# Patient Record
Sex: Female | Born: 1975 | Race: White | Marital: Married | Smoking: Former smoker
Health system: Southern US, Community
[De-identification: ages and names within clinical notes are randomized; demographics above are authoritative.]

## PROBLEM LIST (undated history)

## (undated) DIAGNOSIS — F32A Depression, unspecified: Secondary | ICD-10-CM

## (undated) DIAGNOSIS — R87629 Unspecified abnormal cytological findings in specimens from vagina: Secondary | ICD-10-CM

## (undated) DIAGNOSIS — G43909 Migraine, unspecified, not intractable, without status migrainosus: Secondary | ICD-10-CM

## (undated) DIAGNOSIS — M5430 Sciatica, unspecified side: Secondary | ICD-10-CM

## (undated) DIAGNOSIS — R42 Dizziness and giddiness: Secondary | ICD-10-CM

## (undated) DIAGNOSIS — K219 Gastro-esophageal reflux disease without esophagitis: Secondary | ICD-10-CM

## (undated) DIAGNOSIS — F329 Major depressive disorder, single episode, unspecified: Secondary | ICD-10-CM

## (undated) DIAGNOSIS — T1490XA Injury, unspecified, initial encounter: Secondary | ICD-10-CM

## (undated) DIAGNOSIS — N7011 Chronic salpingitis: Secondary | ICD-10-CM

## (undated) DIAGNOSIS — F419 Anxiety disorder, unspecified: Secondary | ICD-10-CM

## (undated) DIAGNOSIS — I499 Cardiac arrhythmia, unspecified: Secondary | ICD-10-CM

## (undated) DIAGNOSIS — I10 Essential (primary) hypertension: Secondary | ICD-10-CM

## (undated) HISTORY — DX: Injury, unspecified, initial encounter: T14.90XA

## (undated) HISTORY — DX: Essential (primary) hypertension: I10

## (undated) HISTORY — PX: DILATION AND CURETTAGE OF UTERUS: SHX78

## (undated) HISTORY — DX: Unspecified abnormal cytological findings in specimens from vagina: R87.629

## (undated) HISTORY — DX: Chronic salpingitis: N70.11

## (undated) HISTORY — PX: MOUTH SURGERY: SHX715

## (undated) HISTORY — DX: Cardiac arrhythmia, unspecified: I49.9

## (undated) HISTORY — PX: TUBAL LIGATION: SHX77

---

## 2001-05-24 ENCOUNTER — Emergency Department (HOSPITAL_COMMUNITY): Admission: EM | Admit: 2001-05-24 | Discharge: 2001-05-24 | Payer: Self-pay | Admitting: Emergency Medicine

## 2001-12-25 ENCOUNTER — Emergency Department (HOSPITAL_COMMUNITY): Admission: EM | Admit: 2001-12-25 | Discharge: 2001-12-25 | Payer: Self-pay | Admitting: Emergency Medicine

## 2002-01-01 ENCOUNTER — Emergency Department (HOSPITAL_COMMUNITY): Admission: EM | Admit: 2002-01-01 | Discharge: 2002-01-01 | Payer: Self-pay

## 2002-04-18 ENCOUNTER — Emergency Department (HOSPITAL_COMMUNITY): Admission: EM | Admit: 2002-04-18 | Discharge: 2002-04-18 | Payer: Self-pay | Admitting: Emergency Medicine

## 2003-03-31 ENCOUNTER — Encounter: Admission: RE | Admit: 2003-03-31 | Discharge: 2003-03-31 | Payer: Self-pay | Admitting: Internal Medicine

## 2009-10-21 ENCOUNTER — Emergency Department (HOSPITAL_COMMUNITY): Admission: EM | Admit: 2009-10-21 | Discharge: 2009-10-21 | Payer: Self-pay | Admitting: Emergency Medicine

## 2012-03-02 ENCOUNTER — Encounter (HOSPITAL_COMMUNITY): Payer: Self-pay

## 2012-03-02 ENCOUNTER — Emergency Department (HOSPITAL_COMMUNITY)
Admission: EM | Admit: 2012-03-02 | Discharge: 2012-03-02 | Disposition: A | Discharge status: Home / Self Care | Payer: Self-pay | Attending: Emergency Medicine | Admitting: Emergency Medicine

## 2012-03-02 DIAGNOSIS — R102 Pelvic and perineal pain: Secondary | ICD-10-CM

## 2012-03-02 DIAGNOSIS — Z9889 Other specified postprocedural states: Secondary | ICD-10-CM | POA: Insufficient documentation

## 2012-03-02 DIAGNOSIS — Z9851 Tubal ligation status: Secondary | ICD-10-CM | POA: Insufficient documentation

## 2012-03-02 DIAGNOSIS — N949 Unspecified condition associated with female genital organs and menstrual cycle: Secondary | ICD-10-CM | POA: Insufficient documentation

## 2012-03-02 DIAGNOSIS — G43909 Migraine, unspecified, not intractable, without status migrainosus: Secondary | ICD-10-CM | POA: Insufficient documentation

## 2012-03-02 DIAGNOSIS — Z3202 Encounter for pregnancy test, result negative: Secondary | ICD-10-CM | POA: Insufficient documentation

## 2012-03-02 DIAGNOSIS — H81399 Other peripheral vertigo, unspecified ear: Secondary | ICD-10-CM | POA: Insufficient documentation

## 2012-03-02 HISTORY — DX: Migraine, unspecified, not intractable, without status migrainosus: G43.909

## 2012-03-02 HISTORY — DX: Dizziness and giddiness: R42

## 2012-03-02 LAB — URINALYSIS, ROUTINE W REFLEX MICROSCOPIC
Glucose, UA: NEGATIVE mg/dL
Ketones, ur: NEGATIVE mg/dL
Leukocytes, UA: NEGATIVE
Protein, ur: NEGATIVE mg/dL

## 2012-03-02 LAB — WET PREP, GENITAL: Clue Cells Wet Prep HPF POC: NONE SEEN

## 2012-03-02 LAB — URINE MICROSCOPIC-ADD ON

## 2012-03-02 MED ORDER — DIPHENHYDRAMINE HCL 50 MG/ML IJ SOLN
50.0000 mg | Freq: Once | INTRAMUSCULAR | Status: AC
  Administered 2012-03-02: 50 mg via INTRAMUSCULAR
  Filled 2012-03-02: qty 1

## 2012-03-02 MED ORDER — KETOROLAC TROMETHAMINE 60 MG/2ML IM SOLN
60.0000 mg | Freq: Once | INTRAMUSCULAR | Status: AC
  Administered 2012-03-02: 60 mg via INTRAMUSCULAR
  Filled 2012-03-02: qty 2

## 2012-03-02 MED ORDER — PROMETHAZINE HCL 25 MG PO TABS: 25.0000 mg | ORAL_TABLET | Freq: Four times a day (QID) | ORAL | Status: DC | PRN

## 2012-03-02 MED ORDER — PROMETHAZINE HCL 25 MG/ML IJ SOLN
25.0000 mg | Freq: Once | INTRAMUSCULAR | Status: AC
  Administered 2012-03-02: 25 mg via INTRAMUSCULAR
  Filled 2012-03-02: qty 1

## 2012-03-02 NOTE — ED Provider Notes (Signed)
History     CSN: [id number]  Arrival date & time 03/02/12  9562   First MD Initiated Contact with Patient 03/02/12 1000      Chief Complaint  Patient presents with  . Dizziness  . Migraine  . Pelvic Pain     HPI Pt was seen at 1020.   Per pt, c/o gradual onset and persistence of constant multiple complaints for the past 1 to 2 weeks.  Per pt, c/o gradual onset and persistence of constant acute flair of her chronic migraine headache for the past 10 days.  Describes the headache as per her usual chronic migraine headache pain pattern for many years.  Denies headache was sudden or maximal in onset or at any time.  Denies visual changes, no focal motor weakness, no tingling/numbness in extremities, no fevers, no neck pain, no rash. Pt also c/o gradual onset and persistence of constant suprapubic pelvic "pain" for the past 2 weeks.  Has been associated with vaginal discharge with "odor" and painful intercourse.  Pt also c/o sudden onset and persistence of intermittent episodes of "dizziness" since this morning. Describes her symptoms as "everything is moving" and "like I'm on a moving boat."  Worsens with walking, body position change, and turning her head side to side. States she has experienced this "dizziness" intermittently for many years, but did not see a doctor for it.  Denies dysuria, no back pain, no N/V/D.     Past Medical History  Diagnosis Date  . Migraine   . Dizziness     recurrent    Past Surgical History  Procedure Date  . Mouth surgery   . Dilation and curettage of uterus   . Tubal ligation     History  Substance Use Topics  . Smoking status: Never Smoker   . Smokeless tobacco: Never Used  . Alcohol Use: No     Review of Systems ROS: Statement: All systems negative except as marked or noted in the HPI; Constitutional: Negative for fever and chills. ; ; Eyes: Negative for eye pain, redness and discharge. ; ; ENMT: Negative for ear pain, hoarseness, nasal  congestion, sinus pressure and sore throat. ; ; Cardiovascular: Negative for chest pain, palpitations, diaphoresis, dyspnea and peripheral edema. ; ; Respiratory: Negative for cough, wheezing and stridor. ; ; Gastrointestinal: Negative for nausea, vomiting, diarrhea, abdominal pain, blood in stool, hematemesis, jaundice and rectal bleeding. . ; ; Genitourinary: Negative for dysuria, flank pain and hematuria. ; ; GYN:  +pelvic pain, vaginal discharge, pain with intercourse. No vaginal bleeding, no vulvar pain.;; Musculoskeletal: Negative for back pain and neck pain. Negative for swelling and trauma.; ; Skin: Negative for pruritus, rash, abrasions, blisters, bruising and skin lesion.; ; Neuro: +vertigo, migraine headache. Negative for lightheadedness and neck stiffness. Negative for weakness, altered level of consciousness , altered mental status, extremity weakness, paresthesias, involuntary movement, seizure and syncope.       Allergies  Review of patient's allergies indicates no known allergies.  Home Medications  No current outpatient prescriptions on file.  BP 129/64  Pulse 104  Temp 98.2 F (36.8 C)  Resp 16  Wt 159 lb (72.122 kg)  LMP 02/17/2012  Physical Exam 1025: Physical examination:  Nursing notes reviewed; Vital signs and O2 SAT reviewed;  Constitutional: Well developed, Well nourished, Well hydrated, In no acute distress; Head:  Normocephalic, atraumatic; Eyes: EOMI, PERRL, No scleral icterus; ENMT: TM's clear. +edemetous nasal turbinates bilat with clear rhinorrhea. Mouth and pharynx without lesions. No tonsillar  exudates. No intra-oral edema. No hoarse voice, no drooling, no stridor. No pain with manipulation of larynx. Mouth and pharynx normal, Mucous membranes moist; Neck: Supple, Full range of motion, No lymphadenopathy; Cardiovascular: Regular rate and rhythm, No murmur, rub, or gallop; Respiratory: Breath sounds clear & equal bilaterally, No rales, rhonchi, wheezes.  Speaking  full sentences with ease, Normal respiratory effort/excursion; Chest: Nontender, Movement normal; Abdomen: Soft, Nontender, Nondistended, Normal bowel sounds; Genitourinary: No CVA tenderness; Pelvic exam performed with permission of pt and female ED RN assist during exam.  External genitalia w/o lesions. Vaginal vault with thick white discharge.  Cervix w/o lesions, +friable, GC/chlam and wet prep obtained and sent to lab.  Bimanual exam w/o CMT, uterine or adnexal tenderness.;; Extremities: Pulses normal, No tenderness, No edema, No calf edema or asymmetry.; Neuro: AA&Ox3, Major CN grossly intact.  Strength 5/5 equal bilat UE's and LE's.  DTR 2/4 equal bilat UE's and LE's.  No gross sensory deficits.  Normal cerebellar testing bilat UE's (finger-nose) and LE's (heel-shin). Speech clear.  No facial droop. +left horizontal gaze fatigable nystagmus which reproduces her symptoms.;; Skin: Color normal, Warm, Dry.   ED Course  Procedures    MDM  MDM Reviewed: previous chart, nursing note and vitals Interpretation: labs   Results for orders placed during the hospital encounter of 03/02/12  URINALYSIS, ROUTINE W REFLEX MICROSCOPIC      Component Value Range   Color, Urine YELLOW  YELLOW   APPearance CLEAR  CLEAR   Specific Gravity, Urine 1.010  1.005 - 1.030   pH 7.5  5.0 - 8.0   Glucose, UA NEGATIVE  NEGATIVE mg/dL   Hgb urine dipstick MODERATE (*) NEGATIVE   Bilirubin Urine NEGATIVE  NEGATIVE   Ketones, ur NEGATIVE  NEGATIVE mg/dL   Protein, ur NEGATIVE  NEGATIVE mg/dL   Urobilinogen, UA 0.2  0.0 - 1.0 mg/dL   Nitrite NEGATIVE  NEGATIVE   Leukocytes, UA NEGATIVE  NEGATIVE  PREGNANCY, URINE      Component Value Range   Preg Test, Ur NEGATIVE  NEGATIVE  WET PREP, GENITAL      Component Value Range   Yeast Wet Prep HPF POC NONE SEEN  NONE SEEN   Trich, Wet Prep NONE SEEN  NONE SEEN   Clue Cells Wet Prep HPF POC NONE SEEN  NONE SEEN   WBC, Wet Prep HPF POC RARE (*) NONE SEEN  URINE  MICROSCOPIC-ADD ON      Component Value Range   Squamous Epithelial / LPF FEW (*) RARE   RBC / HPF 7-10  <3 RBC/hpf   Bacteria, UA FEW (*) RARE     1415:  Pt's symptoms improved after meds. Not orthostatic on VS.  Has ambulated with steady gait. Wants to go home now.  Dx and testing d/w pt.  Questions answered.  Verb understanding, agreeable to d/c home with outpt f/u.            Laray Anger, DO 03/03/12 1933

## 2012-03-02 NOTE — ED Notes (Signed)
C/o dizziness. Went to urgent care and instructed to come here.

## 2012-03-03 LAB — GC/CHLAMYDIA PROBE AMP: GC Probe RNA: NEGATIVE

## 2012-07-06 ENCOUNTER — Encounter: Payer: Self-pay | Admitting: Adult Health

## 2012-07-06 ENCOUNTER — Encounter: Payer: Self-pay | Admitting: *Deleted

## 2013-12-31 ENCOUNTER — Encounter (HOSPITAL_COMMUNITY): Payer: Self-pay | Admitting: *Deleted

## 2013-12-31 ENCOUNTER — Emergency Department (HOSPITAL_COMMUNITY)
Admission: EM | Admit: 2013-12-31 | Discharge: 2013-12-31 | Disposition: A | Discharge status: Home / Self Care | Payer: Federal, State, Local not specified - PPO | Attending: Emergency Medicine | Admitting: Emergency Medicine

## 2013-12-31 DIAGNOSIS — Z720 Tobacco use: Secondary | ICD-10-CM | POA: Diagnosis not present

## 2013-12-31 DIAGNOSIS — R51 Headache: Secondary | ICD-10-CM | POA: Insufficient documentation

## 2013-12-31 DIAGNOSIS — Z8679 Personal history of other diseases of the circulatory system: Secondary | ICD-10-CM | POA: Diagnosis not present

## 2013-12-31 DIAGNOSIS — Z8739 Personal history of other diseases of the musculoskeletal system and connective tissue: Secondary | ICD-10-CM | POA: Diagnosis not present

## 2013-12-31 DIAGNOSIS — R519 Headache, unspecified: Secondary | ICD-10-CM

## 2013-12-31 HISTORY — DX: Dizziness and giddiness: R42

## 2013-12-31 HISTORY — DX: Sciatica, unspecified side: M54.30

## 2013-12-31 MED ORDER — METOCLOPRAMIDE HCL 5 MG/ML IJ SOLN
10.0000 mg | Freq: Once | INTRAMUSCULAR | Status: AC
  Administered 2013-12-31: 10 mg via INTRAVENOUS
  Filled 2013-12-31: qty 2

## 2013-12-31 MED ORDER — DIPHENHYDRAMINE HCL 50 MG/ML IJ SOLN
50.0000 mg | Freq: Once | INTRAMUSCULAR | Status: AC
  Administered 2013-12-31: 50 mg via INTRAVENOUS
  Filled 2013-12-31: qty 1

## 2013-12-31 MED ORDER — KETOROLAC TROMETHAMINE 30 MG/ML IJ SOLN
30.0000 mg | Freq: Once | INTRAMUSCULAR | Status: AC
  Administered 2013-12-31: 30 mg via INTRAVENOUS
  Filled 2013-12-31: qty 1

## 2013-12-31 MED ORDER — HYDROCODONE-ACETAMINOPHEN 5-325 MG PO TABS: 1.0000 | ORAL_TABLET | Freq: Four times a day (QID) | ORAL | Status: DC | PRN

## 2013-12-31 MED ORDER — HYDROMORPHONE HCL 1 MG/ML IJ SOLN
1.0000 mg | Freq: Once | INTRAMUSCULAR | Status: AC
  Administered 2013-12-31: 1 mg via INTRAVENOUS
  Filled 2013-12-31: qty 1

## 2013-12-31 NOTE — ED Provider Notes (Signed)
CSN: [id number]     Arrival date & time 12/31/13  1312 History  This chart was scribed for Benny LennertJoseph L Oria Klimas, MD by Abel PrestoKara Demonbreun, ED Scribe. This patient was seen in room APA17/APA17 and the patient's care was started at 2:34 PM.    Chief Complaint  Patient presents with  . Headache     Patient is a 38 y.o. female presenting with headaches. The history is provided by the patient. No language interpreter was used.  Headache Pain location:  Generalized Radiates to:  Does not radiate Onset quality:  Sudden Duration:  3 days Timing:  Constant Progression:  Unchanged Chronicity:  Recurrent Similar to prior headaches: yes   Context comment:  History of migraines Relieved by:  Nothing Worsened by:  Light Ineffective treatments: execedrin and naproxen. Associated symptoms: photophobia   Associated symptoms: no abdominal pain, no back pain, no congestion, no cough, no diarrhea, no fatigue, no seizures and no sinus pressure     HPI Comments: Jenna Jones is a 38 y.o. female who presents to the Emergency Department complaining of headache with onset Friday.  Pt states headache is similar to previous migraine. Pt indicates headache worsens with light. Pt states she has taken 6 execedrin extra strength, 2 naproxen 375 mg today with no relief. Pt has been given Benedryl and Phenergan during previous visitations to the ED.     Past Medical History  Diagnosis Date  . Migraine   . Dizziness     recurrent  . Sciatica   . Vertigo    Past Surgical History  Procedure Laterality Date  . Mouth surgery    . Dilation and curettage of uterus    . Tubal ligation     History reviewed. No pertinent family history. History  Substance Use Topics  . Smoking status: Current Every Day Smoker    Types: Cigarettes  . Smokeless tobacco: Never Used  . Alcohol Use: No   OB History    No data available     Review of Systems  Constitutional: Negative for appetite change and fatigue.  HENT: Negative  for congestion, ear discharge and sinus pressure.   Eyes: Positive for photophobia. Negative for discharge.  Respiratory: Negative for cough.   Cardiovascular: Negative for chest pain.  Gastrointestinal: Negative for abdominal pain and diarrhea.  Genitourinary: Negative for frequency and hematuria.  Musculoskeletal: Negative for back pain.  Skin: Negative for rash.  Neurological: Positive for headaches. Negative for seizures.  Psychiatric/Behavioral: Negative for hallucinations.      Allergies  Codeine  Home Medications   Prior to Admission medications   Medication Sig Start Date End Date Taking? Authorizing Provider  promethazine (PHENERGAN) 25 MG tablet Take 1 tablet (25 mg total) by mouth every 6 (six) hours as needed for nausea. 03/02/12   Samuel JesterKathleen McManus, DO   BP 149/92 mmHg  Pulse 102  Temp(Src) 99 F (37.2 C) (Oral)  Resp 18  Ht 5\' 2"  (1.575 m)  Wt 180 lb (81.647 kg)  BMI 32.91 kg/m2  SpO2 100%  LMP 12/17/2013 (Exact Date) Physical Exam  Constitutional: She is oriented to person, place, and time. She appears well-developed.  HENT:  Head: Normocephalic.  Eyes: Conjunctivae and EOM are normal. No scleral icterus.  Neck: Neck supple. No thyromegaly present.  Cardiovascular: Normal rate and regular rhythm.  Exam reveals no gallop and no friction rub.   No murmur heard. Pulmonary/Chest: No stridor. She has no wheezes. She has no rales. She exhibits no tenderness.  Abdominal: She exhibits no distension. There is no tenderness. There is no rebound.  Musculoskeletal: Normal range of motion. She exhibits no edema.  Lymphadenopathy:    She has no cervical adenopathy.  Neurological: She is oriented to person, place, and time. She exhibits normal muscle tone. Coordination normal.  Skin: No rash noted. No erythema.  Psychiatric: She has a normal mood and affect. Her behavior is normal.  Nursing note and vitals reviewed.   ED Course  Procedures (including critical care  time) DIAGNOSTIC STUDIES: Oxygen Saturation is 100% on room air, normal by my interpretation.    COORDINATION OF CARE: 2:43 PM Discussed treatment plan with patient at beside, the patient agrees with the plan and has no further questions at this time.   Labs Review Labs Reviewed - No data to display  Imaging Review No results found.   EKG Interpretation None      MDM   Final diagnoses:  None     The chart was scribed for me under my direct supervision.  I personally performed the history, physical, and medical decision making and all procedures in the evaluation of this patient.Benny Lennert.    Vicki Chaffin L Niva Murren, MD 12/31/13 [phone]

## 2013-12-31 NOTE — ED Notes (Signed)
Headache since Friday, with nausea, and dizziness.  No hx of HI,  Hx of migraines since 38 yo

## 2013-12-31 NOTE — Discharge Instructions (Signed)
Follow up as needed

## 2014-05-21 ENCOUNTER — Encounter (HOSPITAL_COMMUNITY): Payer: Self-pay | Admitting: Emergency Medicine

## 2014-05-21 ENCOUNTER — Emergency Department (HOSPITAL_COMMUNITY)
Admission: EM | Admit: 2014-05-21 | Discharge: 2014-05-21 | Disposition: A | Discharge status: Home / Self Care | Payer: Federal, State, Local not specified - PPO | Attending: Emergency Medicine | Admitting: Emergency Medicine

## 2014-05-21 DIAGNOSIS — G43909 Migraine, unspecified, not intractable, without status migrainosus: Secondary | ICD-10-CM | POA: Insufficient documentation

## 2014-05-21 DIAGNOSIS — M5430 Sciatica, unspecified side: Secondary | ICD-10-CM | POA: Insufficient documentation

## 2014-05-21 DIAGNOSIS — Z720 Tobacco use: Secondary | ICD-10-CM | POA: Diagnosis not present

## 2014-05-21 DIAGNOSIS — G8929 Other chronic pain: Secondary | ICD-10-CM | POA: Diagnosis not present

## 2014-05-21 DIAGNOSIS — Z791 Long term (current) use of non-steroidal anti-inflammatories (NSAID): Secondary | ICD-10-CM | POA: Diagnosis not present

## 2014-05-21 DIAGNOSIS — R51 Headache: Secondary | ICD-10-CM | POA: Diagnosis present

## 2014-05-21 DIAGNOSIS — R040 Epistaxis: Secondary | ICD-10-CM | POA: Diagnosis not present

## 2014-05-21 DIAGNOSIS — R000 Tachycardia, unspecified: Secondary | ICD-10-CM | POA: Insufficient documentation

## 2014-05-21 DIAGNOSIS — R112 Nausea with vomiting, unspecified: Secondary | ICD-10-CM | POA: Insufficient documentation

## 2014-05-21 DIAGNOSIS — Z7982 Long term (current) use of aspirin: Secondary | ICD-10-CM | POA: Insufficient documentation

## 2014-05-21 DIAGNOSIS — G43009 Migraine without aura, not intractable, without status migrainosus: Secondary | ICD-10-CM

## 2014-05-21 MED ORDER — DIPHENHYDRAMINE HCL 50 MG/ML IJ SOLN
25.0000 mg | Freq: Once | INTRAMUSCULAR | Status: AC
  Administered 2014-05-21: 25 mg via INTRAVENOUS
  Filled 2014-05-21: qty 1

## 2014-05-21 MED ORDER — DEXAMETHASONE SODIUM PHOSPHATE 4 MG/ML IJ SOLN
8.0000 mg | Freq: Once | INTRAMUSCULAR | Status: AC
  Administered 2014-05-21: 8 mg via INTRAVENOUS
  Filled 2014-05-21: qty 2

## 2014-05-21 MED ORDER — SUMATRIPTAN SUCCINATE 100 MG PO TABS: 100.0000 mg | ORAL_TABLET | ORAL | Status: DC | PRN

## 2014-05-21 MED ORDER — KETOROLAC TROMETHAMINE 30 MG/ML IJ SOLN
30.0000 mg | Freq: Once | INTRAMUSCULAR | Status: AC
  Administered 2014-05-21: 30 mg via INTRAVENOUS
  Filled 2014-05-21: qty 1

## 2014-05-21 MED ORDER — PROMETHAZINE HCL 25 MG PO TABS: 25.0000 mg | ORAL_TABLET | Freq: Four times a day (QID) | ORAL | Status: DC | PRN

## 2014-05-21 MED ORDER — PROMETHAZINE HCL 25 MG/ML IJ SOLN
25.0000 mg | Freq: Once | INTRAMUSCULAR | Status: AC
  Administered 2014-05-21: 25 mg via INTRAVENOUS
  Filled 2014-05-21: qty 1

## 2014-05-21 MED ORDER — SODIUM CHLORIDE 0.9 % IV BOLUS (SEPSIS)
1000.0000 mL | Freq: Once | INTRAVENOUS | Status: AC
Start: 2014-05-21 — End: 2014-05-21
  Administered 2014-05-21: 1000 mL via INTRAVENOUS

## 2014-05-21 NOTE — ED Provider Notes (Signed)
CSN: [id number]     Arrival date & time 05/21/14  1531 History   This chart was scribed for Jenna CoreNathan Seona Clemenson, MD by Evon Slackerrance Branch, ED Scribe. This patient was seen in room APA16A/APA16A and the patient's care was started at 4:14 PM.     Chief Complaint  Patient presents with  . Migraine   The history is provided by the patient. No language interpreter was used.   HPI Comments: Jenna Jones is a 39 y.o. female who presents to the Emergency Department complaining of worsening migraine HA onset today at 11:30 AM. Pt states that her HA is located on the left side behind the eye and temporal region. Pt states she has some associated nausea, vomiting and photophobia. Pt reports epistaxis as well but states that this is normal when she has migraines. Pt states she has tried tylenol and ibuprofen with no relief. Pt states that HA feels like previous headaches. Pt states she gets HA's almost daily. Pt denies dizziness or related symptoms.    Past Medical History  Diagnosis Date  . Migraine   . Dizziness     recurrent  . Sciatica   . Vertigo    Past Surgical History  Procedure Laterality Date  . Mouth surgery    . Dilation and curettage of uterus    . Tubal ligation     Family History  Problem Relation Age of Onset  . Cancer Other   . Diabetes Other   . Hypertension Other   . Thyroid disease Other    History  Substance Use Topics  . Smoking status: Current Every Day Smoker -- 0.50 packs/day for 16 years    Types: Cigarettes  . Smokeless tobacco: Never Used  . Alcohol Use: No   OB History    Gravida Para Term Preterm AB TAB SAB Ectopic Multiple Living   8 5 5  3  3   5       Review of Systems  HENT: Positive for nosebleeds.   Eyes: Positive for photophobia.  Gastrointestinal: Positive for nausea and vomiting.  Neurological: Positive for headaches. Negative for dizziness.  All other systems reviewed and are negative.   Allergies  Codeine  Home Medications   Prior to  Admission medications   Medication Sig Start Date End Date Taking? Authorizing Provider  aspirin-acetaminophen-caffeine (EXCEDRIN MIGRAINE) [phone] MG per tablet Take 3-4 tablets by mouth daily as needed for headache or migraine.    Historical Provider, MD  HYDROcodone-acetaminophen (NORCO/VICODIN) 5-325 MG per tablet Take 1 tablet by mouth every 6 (six) hours as needed for moderate pain. 12/31/13   Bethann BerkshireJoseph Zammit, MD  ibuprofen (ADVIL,MOTRIN) 200 MG tablet Take 1,600 mg by mouth daily as needed for headache.    Historical Provider, MD  naproxen (NAPROSYN) 375 MG tablet Take 500 mg by mouth once.    Historical Provider, MD  promethazine (PHENERGAN) 25 MG tablet Take 1 tablet (25 mg total) by mouth every 6 (six) hours as needed for nausea. Patient not taking: Reported on 12/31/2013 03/02/12   Samuel JesterKathleen McManus, DO  traMADol (ULTRAM) 50 MG tablet Take 50 mg by mouth once.    Historical Provider, MD   BP 155/90 mmHg  Pulse 97  Temp(Src) 98 F (36.7 C) (Oral)  Resp 18  Wt 180 lb (81.647 kg)  SpO2 100%  LMP 05/14/2014   Physical Exam  Constitutional: She is oriented to person, place, and time. She appears well-developed and well-nourished. No distress.  HENT:  Head: Normocephalic and  atraumatic.  mildly photophobic, some tenderness to left side of face without swelling.   Eyes: Conjunctivae and EOM are normal.  Neck: Neck supple. No tracheal deviation present.  Cardiovascular: Tachycardia present.   Pulmonary/Chest: Effort normal. No respiratory distress.  Abdominal: There is no tenderness.  Musculoskeletal: Normal range of motion.  Neurological: She is alert and oriented to person, place, and time.  Skin: Skin is warm and dry.  Psychiatric: She has a normal mood and affect. Her behavior is normal.  Nursing note and vitals reviewed.   ED Course  Procedures (including critical care time) Labs Review Labs Reviewed - No data to display  Imaging Review No results found.   EKG  Interpretation None      MDM   Final diagnoses:  None    Patient with headaches. Frequent daily headaches. History of migraines and cluster headaches. Feels somewhat better after treatment. Pain is gone from an 8 to a 3. Will give patient some Phenergan and Imitrex for home. Patient instructed on the need for neurology follow-up. Will discharge. I personally performed the services described in this documentation, which was scribed in my presence. The recorded information has been reviewed and is accurate.        Jenna Core, MD 05/21/14 1902

## 2014-05-21 NOTE — ED Notes (Signed)
Patient c/o migraine headache that started last night. Per patient hx of cluster and auro migraines. Per patient this migraine feels like cluster migraine behind left eye. Denies any vomiting but reports nausea and small amount of bleeding from left nare which patient states is not unusual when she has a migraine. No bleeding noted at this time. Denies any dizziness.

## 2014-05-21 NOTE — Discharge Instructions (Signed)

## 2014-05-21 NOTE — ED Notes (Signed)
Patient placed on NRB.

## 2014-12-30 DIAGNOSIS — R42 Dizziness and giddiness: Secondary | ICD-10-CM | POA: Insufficient documentation

## 2015-06-02 ENCOUNTER — Encounter (HOSPITAL_COMMUNITY): Payer: Self-pay | Admitting: Emergency Medicine

## 2015-06-02 ENCOUNTER — Emergency Department (HOSPITAL_COMMUNITY)
Admission: EM | Admit: 2015-06-02 | Discharge: 2015-06-03 | Disposition: A | Discharge status: Home / Self Care | Payer: Federal, State, Local not specified - PPO | Attending: Emergency Medicine | Admitting: Emergency Medicine

## 2015-06-02 DIAGNOSIS — R200 Anesthesia of skin: Secondary | ICD-10-CM | POA: Diagnosis not present

## 2015-06-02 DIAGNOSIS — Z791 Long term (current) use of non-steroidal anti-inflammatories (NSAID): Secondary | ICD-10-CM | POA: Diagnosis not present

## 2015-06-02 DIAGNOSIS — G43909 Migraine, unspecified, not intractable, without status migrainosus: Secondary | ICD-10-CM | POA: Diagnosis not present

## 2015-06-02 DIAGNOSIS — H9319 Tinnitus, unspecified ear: Secondary | ICD-10-CM | POA: Insufficient documentation

## 2015-06-02 DIAGNOSIS — F17210 Nicotine dependence, cigarettes, uncomplicated: Secondary | ICD-10-CM | POA: Insufficient documentation

## 2015-06-02 DIAGNOSIS — G43009 Migraine without aura, not intractable, without status migrainosus: Secondary | ICD-10-CM | POA: Insufficient documentation

## 2015-06-02 LAB — CBC WITH DIFFERENTIAL/PLATELET
BASOS ABS: 0 10*3/uL (ref 0.0–0.1)
BASOS PCT: 0 %
Eosinophils Absolute: 0.1 10*3/uL (ref 0.0–0.7)
Eosinophils Relative: 1 %
HEMATOCRIT: 38 % (ref 36.0–46.0)
HEMOGLOBIN: 13 g/dL (ref 12.0–15.0)
Lymphocytes Relative: 26 %
Lymphs Abs: 2.5 10*3/uL (ref 0.7–4.0)
MCH: 31.2 pg (ref 26.0–34.0)
MCHC: 34.2 g/dL (ref 30.0–36.0)
MCV: 91.1 fL (ref 78.0–100.0)
MONOS PCT: 5 %
Monocytes Absolute: 0.5 10*3/uL (ref 0.1–1.0)
NEUTROS ABS: 6.6 10*3/uL (ref 1.7–7.7)
NEUTROS PCT: 68 %
Platelets: 402 10*3/uL — ABNORMAL HIGH (ref 150–400)
RBC: 4.17 MIL/uL (ref 3.87–5.11)
RDW: 13 % (ref 11.5–15.5)
WBC: 9.8 10*3/uL (ref 4.0–10.5)

## 2015-06-02 MED ORDER — DEXAMETHASONE SODIUM PHOSPHATE 10 MG/ML IJ SOLN
10.0000 mg | Freq: Once | INTRAMUSCULAR | Status: AC
  Administered 2015-06-02: 10 mg via INTRAVENOUS
  Filled 2015-06-02: qty 1

## 2015-06-02 MED ORDER — DIPHENHYDRAMINE HCL 50 MG/ML IJ SOLN
25.0000 mg | Freq: Once | INTRAMUSCULAR | Status: AC
  Administered 2015-06-02: 25 mg via INTRAVENOUS
  Filled 2015-06-02: qty 1

## 2015-06-02 MED ORDER — METOCLOPRAMIDE HCL 5 MG/ML IJ SOLN
10.0000 mg | Freq: Once | INTRAMUSCULAR | Status: AC
  Administered 2015-06-02: 10 mg via INTRAVENOUS
  Filled 2015-06-02: qty 2

## 2015-06-02 MED ORDER — SODIUM CHLORIDE 0.9 % IV BOLUS (SEPSIS)
1000.0000 mL | Freq: Once | INTRAVENOUS | Status: AC
  Administered 2015-06-02: 1000 mL via INTRAVENOUS

## 2015-06-02 NOTE — ED Provider Notes (Addendum)
CSN: [id number]     Arrival date & time 06/02/15  2046 History  By signing my name below, I, [facility], attest that this documentation has been prepared under the direction and in the presence of Devoria Albe, MD at 2315 PM. Electronically Signed: Randell Patient, ED Scribe. 06/03/2015. 1:18 AM.   Chief Complaint  Patient presents with  . Migraine   The history is provided by the patient. No language interpreter was used.  HPI Comments: Jenna Jones is a 40 y.o. female with a PMHx of migraines who presents to the Emergency Department complaining of constant, gradually worsening, HA That she describes like a vice and burning sensation of her head onset earlier this morning upon waking at 10:30 AM. She reports dizziness, nausea, photophobia, phonophobia, numbness, tingling along her bottom lip, and tinnitus that is baseline. She has taken ibuprofen (24 pills today) without relief. She notes similar HAs in the past since she was a child (18 yrs old)  that she has daily but states current HA is greater in severity like they sometimes are. She smokes 10 cigarettes a day. Per patient, she stopped taking Excedrin 2 weeks ago when she began vomiting up blood. Denies vomiting, blurred vision, numbness or tingling of her extremities.Marland Kitchen  PCP none  Past Medical History  Diagnosis Date  . Migraine   . Dizziness     recurrent  . Sciatica   . Vertigo    Past Surgical History  Procedure Laterality Date  . Mouth surgery    . Dilation and curettage of uterus    . Tubal ligation     Family History  Problem Relation Age of Onset  . Cancer Other   . Diabetes Other   . Hypertension Other   . Thyroid disease Other    Social History  Substance Use Topics  . Smoking status: Current Every Day Smoker -- 0.50 packs/day for 16 years    Types: Cigarettes  . Smokeless tobacco: Never Used  . Alcohol Use: No   Unemployed Lives with spouse  OB History    Gravida Para Term Preterm AB TAB SAB  Ectopic Multiple Living   Review of Systems  HENT: Positive for tinnitus.   Eyes: Positive for photophobia. Negative for visual disturbance.  Gastrointestinal: Positive for nausea. Negative for vomiting.  Neurological: Positive for dizziness, numbness and headaches.  All other systems reviewed and are negative.  Allergies  Codeine  Home Medications   Prior to Admission medications   Medication Sig Start Date End Date Taking? Authorizing Provider  ibuprofen (ADVIL,MOTRIN) 200 MG tablet Take 1,600 mg by mouth daily as needed for headache or moderate pain.    Yes Historical Provider, MD   BP 154/77 mmHg  Pulse 100  Temp(Src) 97.8 F (36.6 C) (Oral)  Resp 16  Ht  (1.575 m)  Wt 161 lb (73.029 kg)  BMI 29.44 kg/m2  SpO2 98%  LMP 05/29/2015  Vital signs normal except borderline tachycardia  Physical Exam  Constitutional: She is oriented to person, place, and time. She appears well-developed and well-nourished.  Non-toxic appearance. She does not appear ill. She appears distressed.  HENT:  Head: Normocephalic and atraumatic.  Right Ear: External ear normal.  Left Ear: External ear normal.  Nose: Nose normal. No mucosal edema or rhinorrhea.  Mouth/Throat: Oropharynx is clear and moist and mucous membranes are normal. No dental abscesses or uvula swelling.  Eyes:  Conjunctivae and EOM are normal. Pupils are equal, round, and reactive to light.  Appears to have photophobia.  Neck: Normal range of motion and full passive range of motion without pain. Neck supple.  Cardiovascular: Normal rate, regular rhythm and normal heart sounds.  Exam reveals no gallop and no friction rub.   No murmur heard. Pulmonary/Chest: Effort normal and breath sounds normal. No respiratory distress. She has no wheezes. She has no rhonchi. She has no rales. She exhibits no tenderness and no crepitus.  Abdominal: Soft. Normal appearance and bowel sounds are normal. She exhibits no  distension. There is no tenderness. There is no rebound and no guarding.  Musculoskeletal: Normal range of motion. She exhibits no edema or tenderness.  Moves all extremities well.   Neurological: She is alert and oriented to person, place, and time. She has normal strength. No cranial nerve deficit.  No facial asymmetry. No weakness in grip bilaterally. No pronator drift. No lower extremity weakness.  Skin: Skin is warm, dry and intact. No rash noted. No erythema. No pallor.  Psychiatric: She has a normal mood and affect. Her speech is normal and behavior is normal. Her mood appears not anxious.  Nursing note and vitals reviewed.   ED Course  Procedures   Medications  sodium chloride 0.9 % bolus 1,000 mL (0 mLs Intravenous Stopped 06/03/15 0059)  sodium chloride 0.9 % bolus 1,000 mL (0 mLs Intravenous Stopped 06/03/15 0059)  metoCLOPramide (REGLAN) injection 10 mg (10 mg Intravenous Given 06/02/15 2355)  diphenhydrAMINE (BENADRYL) injection 25 mg (25 mg Intravenous Given 06/02/15 2352)  dexamethasone (DECADRON) injection 10 mg (10 mg Intravenous Given 06/02/15 2349)     DIAGNOSTIC STUDIES: Oxygen Saturation is 99% on RA, normal by my interpretation.    COORDINATION OF CARE: 11:15 PM Will order IV fluids, labs, and migraine cocktail. Discussed treatment plan with pt at bedside and pt agreed to plan.Labs were done due to patient's excessive use of ibuprofen with concern of renal disease, and excessive use of acetaminophen and concern for liver disease.  12:31 AM Returned to recheck pt. Pt has improved.States her headache is improved enough to be discharged. Will discharge home.  Labs Review Results for orders placed or performed during the hospital encounter of 06/02/15  Comprehensive metabolic panel  Result Value Ref Range   Sodium 140 135 - 145 mmol/L   Potassium 3.8 3.5 - 5.1 mmol/L   Chloride 110 101 - 111 mmol/L   CO2 20 (L) 22 - 32 mmol/L   Glucose, Bld 109 (H) 65 - 99 mg/dL    BUN 10 6 - 20 mg/dL   Creatinine, Ser 1.61 0.44 - 1.00 mg/dL   Calcium 9.0 8.9 - 09.6 mg/dL   Total Protein 7.5 6.5 - 8.1 g/dL   Albumin 4.2 3.5 - 5.0 g/dL   AST 17 15 - 41 U/L   ALT 17 14 - 54 U/L   Alkaline Phosphatase 85 38 - 126 U/L   Total Bilirubin 0.3 0.3 - 1.2 mg/dL   GFR calc non Af Amer >60 >60 mL/min   GFR calc Af Amer >60 >60 mL/min   Anion gap 10 5 - 15  CBC with Differential  Result Value Ref Range   WBC 9.8 4.0 - 10.5 K/uL   RBC 4.17 3.87 - 5.11 MIL/uL   Hemoglobin 13.0 12.0 - 15.0 g/dL   HCT 04.5 40.9 - 81.1 %   MCV 91.1 78.0 - 100.0 fL   MCH 31.2 26.0 - 34.0 pg  MCHC 34.2 30.0 - 36.0 g/dL   RDW 09.813.0 11.911.5 - 14.715.5 %   Platelets 402 (H) 150 - 400 K/uL   Neutrophils Relative % 68 %   Neutro Abs 6.6 1.7 - 7.7 K/uL   Lymphocytes Relative 26 %   Lymphs Abs 2.5 0.7 - 4.0 K/uL   Monocytes Relative 5 %   Monocytes Absolute 0.5 0.1 - 1.0 K/uL   Eosinophils Relative 1 %   Eosinophils Absolute 0.1 0.0 - 0.7 K/uL   Basophils Relative 0 %   Basophils Absolute 0.0 0.0 - 0.1 K/uL   Laboratory interpretation all normal     I have personally reviewed and evaluated these lab results as part of my medical decision-making.    MDM   Final diagnoses:  Migraine without aura and without status migrainosus, not intractable   Plan discharge  Plan discharge   I personally performed the services described in this documentation, which was scribed in my presence. The recorded information has been reviewed and considered.  Devoria AlbeIva Daphane Odekirk, MD, Concha PyoFACEP    Snyder Colavito, MD 06/03/15 [id number]  Devoria AlbeIva Maxum Cassarino, MD 06/03/15 0120

## 2015-06-02 NOTE — ED Notes (Signed)
Physician in to assess 

## 2015-06-02 NOTE — ED Notes (Signed)
Lights dimmed- ice pack provided, awaiting eval

## 2015-06-02 NOTE — ED Notes (Signed)
Pt woke up with migraine, has taken 24 ibuprofen to day and 1g of tylenol, with out relief

## 2015-06-02 NOTE — ED Notes (Signed)
Pt reports that she had last a headache 16 years ago, that when she has them they last one week at a time. She is photophobic but neuro intact- she ambulates heel to toe without stagger or drift. She reports as does her SO, taking "handfuls" of ibuprofen and is educated by this RN regarding renal/ibuprofen concerns with reports of handfuls of motrin taken for headache. Pt has specific physician that she wants to see, and request a referral

## 2015-06-03 LAB — COMPREHENSIVE METABOLIC PANEL
ALBUMIN: 4.2 g/dL (ref 3.5–5.0)
ALK PHOS: 85 U/L (ref 38–126)
ALT: 17 U/L (ref 14–54)
AST: 17 U/L (ref 15–41)
Anion gap: 10 (ref 5–15)
BILIRUBIN TOTAL: 0.3 mg/dL (ref 0.3–1.2)
BUN: 10 mg/dL (ref 6–20)
CALCIUM: 9 mg/dL (ref 8.9–10.3)
CO2: 20 mmol/L — AB (ref 22–32)
CREATININE: 0.79 mg/dL (ref 0.44–1.00)
Chloride: 110 mmol/L (ref 101–111)
GFR calc Af Amer: >60 mL/min (ref >60)
GFR calc non Af Amer: >60 mL/min (ref >60)
GLUCOSE: 109 mg/dL — AB (ref 65–99)
Potassium: 3.8 mmol/L (ref 3.5–5.1)
SODIUM: 140 mmol/L (ref 135–145)
Total Protein: 7.5 g/dL (ref 6.5–8.1)

## 2015-06-03 NOTE — Discharge Instructions (Signed)
Go home and rest. PLEASE DO NOT TAKE MORE THAN 3000 MG OF ACETAMINOPHEN A DAY OR 2400 MG OF IBUPROFEN A DAY OR YOU CAN HAVE PERMANENT KIDNEY OR LIVER DAMAGE, OR HAVE ESOPHAGEAL OR STOMACH ULCERS.  Too much aspirin can cause ringing in the ears.  Consider seeing a neurologist for your chronic headaches.

## 2015-06-03 NOTE — ED Notes (Signed)
Out of bed to bathroom- pt complains of vertigo

## 2015-11-17 DIAGNOSIS — J069 Acute upper respiratory infection, unspecified: Secondary | ICD-10-CM | POA: Diagnosis not present

## 2015-11-17 DIAGNOSIS — J329 Chronic sinusitis, unspecified: Secondary | ICD-10-CM | POA: Diagnosis not present

## 2015-11-17 DIAGNOSIS — J40 Bronchitis, not specified as acute or chronic: Secondary | ICD-10-CM | POA: Diagnosis not present

## 2016-08-09 DIAGNOSIS — K080 Exfoliation of teeth due to systemic causes: Secondary | ICD-10-CM | POA: Diagnosis not present

## 2016-11-18 ENCOUNTER — Encounter: Payer: Self-pay | Admitting: Physician Assistant

## 2016-11-18 ENCOUNTER — Ambulatory Visit (INDEPENDENT_AMBULATORY_CARE_PROVIDER_SITE_OTHER): Payer: Federal, State, Local not specified - PPO | Admitting: Physician Assistant

## 2016-11-18 DIAGNOSIS — I10 Essential (primary) hypertension: Secondary | ICD-10-CM | POA: Insufficient documentation

## 2016-11-18 LAB — BASIC METABOLIC PANEL WITH GFR
BUN: 8 mg/dL (ref 7–25)
CALCIUM: 9.6 mg/dL (ref 8.6–10.2)
CO2: 22 mmol/L (ref 20–32)
CREATININE: 0.9 mg/dL (ref 0.50–1.10)
Chloride: 106 mmol/L (ref 98–110)
GFR, EST AFRICAN AMERICAN: 91 mL/min/{1.73_m2} (ref >=60)
GFR, EST NON AFRICAN AMERICAN: 79 mL/min/{1.73_m2} (ref >=60)
Glucose, Bld: 93 mg/dL (ref 65–99)
Potassium: 4.3 mmol/L (ref 3.5–5.3)
Sodium: 139 mmol/L (ref 135–146)

## 2016-11-18 MED ORDER — LOSARTAN POTASSIUM 50 MG PO TABS: 50.0000 mg | ORAL_TABLET | Freq: Every day | ORAL | 0 refills | Status: DC

## 2016-11-18 NOTE — Progress Notes (Signed)
Patient ID: JAVEN RIDINGS MRN: [id number], DOB: 1975-02-26, 41 y.o. Date of Encounter: @  Chief Complaint:  Chief Complaint  Patient presents with  . New Patient (Initial Visit)    HPI: 41 y.o. year old female  presents as a New Patient to Establish Care.   She reports that "her dentist had her see an oral surgeon but they won't do surgery because [her] blood pressure is too high". Brings in dental xray was done and on this-- has a sticky note on it --with the date and blood pressure readings from that date. The date was 09/02/16. The blood pressure readings were 179/115, 187/108, 183/124, 181/127.  She has not had her blood pressure checked anywhere else to know how it has been running.  She also starts listing off other medical problems that she has had that she needs managed. States that she has mostly just been going to the urgent care when needed "or if has a really bad migraine" would go to the ER.  She mentions the following medical problems: Migraines, heartburn, and problems with sciatica--- says mostly goes down the left leg but occasionally down the right if sits a long time.  She mentions wanting to see Dr. Gerilyn Pilgrim regarding her migraines. I asked if she is on any medication for the migraines. States that she is not. She says that in the past she was on Imitrex, Flexeril, Paxil, Naprosyn and those worked well.  Regarding the heartburn-- says that she uses over-the-counter rimantadine for this.  She states that she last saw a gynecologist in 2001.  She makes reference to her husband being in [employer] so asked about this. She says that he was in the military 1987 - 2002 but then was discharged from the [employer] because of his asthma.  Asked if she works outside of the home. Her response is -- "No, she does not work-- that every time she's had a job, she either gets fired or has to quit."  Adds that she is a high school dropout.  Later during the conversation she  talks about the fact that her son has some form of Medicaid and that she has not been able to find any office that will accept his insurance/see him. (I am not accepting him as a new patient.)  Even with having such high blood pressure, she denies any associated symptoms. Is having no chest pressure or chest heaviness chest tightness. No increased shortness of breath or dyspnea on exertion. Has had no neurologic changes/neurologic deficits. No weakness in any extremity or slurred speech.   Past Medical History:  Diagnosis Date  . Dizziness    recurrent  . Migraine   . Sciatica   . Vertigo      Home Meds: Outpatient Medications Prior to Visit  Medication Sig Dispense Refill  . ibuprofen (ADVIL,MOTRIN) 200 MG tablet Take 1,600 mg by mouth daily as needed for headache or moderate pain.      No facility-administered medications prior to visit.     Allergies:  Allergies  Allergen Reactions  . Codeine Itching    Social History   Social History  . Marital status: Married    Spouse name: N/A  . Number of children: N/A  . Years of education: N/A   Occupational History  . Not on file.   Social History Main Topics  . Smoking status: Current Every Day Smoker    Packs/day: 0.50    Years: 16.00    Types: Cigarettes  .  Smokeless tobacco: Never Used  . Alcohol use No  . Drug use: No  . Sexual activity: Yes    Birth control/ protection: Surgical   Other Topics Concern  . Not on file   Social History Narrative  . No narrative on file    Family History  Problem Relation Age of Onset  . Cancer Other   . Diabetes Other   . Hypertension Other   . Thyroid disease Other      Review of Systems:  See HPI for pertinent ROS. All other ROS negative.    Physical Exam: Blood pressure (!) 170/100, pulse (!) 105, temperature 98.4 F (36.9 C), temperature source Oral, resp. rate 16, height  (1.549 m), weight 68.7 kg (151 lb 6.4 oz), last menstrual period 11/12/2016, SpO2 94  %., Body mass index is 28.61 kg/m. General: WNWD WF. Appears in no acute distress. Neck: Supple. No thyromegaly. No lymphadenopathy. Lungs: Clear bilaterally to auscultation without wheezes, rales, or rhonchi. Breathing is unlabored. Heart: RRR with S1 S2. No murmurs, rubs, or gallops. Musculoskeletal:  Strength and tone normal for age. Extremities/Skin: Warm and dry. No edema. Neuro: Alert and oriented X 3. Moves all extremities spontaneously. Gait is normal. CNII-XII grossly in tact. Psych:  Responds to questions appropriately with a normal affect.     ASSESSMENT AND PLAN:  41 y.o. year old female with  1. Malignant hypertension Will Check bmet. She will start losartan daily. She is to take today's dose as soon as she picks this up at the pharmacy. She will then take 1 each morning. She will return for follow-up visit in one week. - losartan (COZAAR) 50 MG tablet; Take 1 tablet (50 mg total) by mouth daily.  Dispense: 30 tablet; Refill: 0 - BASIC METABOLIC PANEL WITH GFR  I will first focus on getting her blood pressure to goal/controlled. Once we get her blood pressure controlled then will start to address her other medical problems including migraines, GERD, sciatica. Then will schedule her for complete physical exam to update preventative care.     Signed, [address] [place], [place], [facility] 11/18/2016 10:54 AM

## 2016-11-25 ENCOUNTER — Encounter: Payer: Self-pay | Admitting: Physician Assistant

## 2016-11-25 ENCOUNTER — Ambulatory Visit (INDEPENDENT_AMBULATORY_CARE_PROVIDER_SITE_OTHER): Payer: Federal, State, Local not specified - PPO | Admitting: Physician Assistant

## 2016-11-25 VITALS — BP 142/88 | HR 112 | Temp 98.3°F | Resp 16 | Ht 61.0 in | Wt 152.0 lb

## 2016-11-25 DIAGNOSIS — G43109 Migraine with aura, not intractable, without status migrainosus: Secondary | ICD-10-CM | POA: Diagnosis not present

## 2016-11-25 DIAGNOSIS — K219 Gastro-esophageal reflux disease without esophagitis: Secondary | ICD-10-CM

## 2016-11-25 DIAGNOSIS — Z114 Encounter for screening for human immunodeficiency virus [HIV]: Secondary | ICD-10-CM | POA: Diagnosis not present

## 2016-11-25 DIAGNOSIS — I10 Essential (primary) hypertension: Secondary | ICD-10-CM | POA: Diagnosis not present

## 2016-11-25 DIAGNOSIS — N946 Dysmenorrhea, unspecified: Secondary | ICD-10-CM

## 2016-11-25 DIAGNOSIS — G43909 Migraine, unspecified, not intractable, without status migrainosus: Secondary | ICD-10-CM | POA: Insufficient documentation

## 2016-11-25 DIAGNOSIS — N941 Unspecified dyspareunia: Secondary | ICD-10-CM

## 2016-11-25 MED ORDER — PAROXETINE HCL 20 MG PO TABS: 20.0000 mg | ORAL_TABLET | Freq: Every day | ORAL | 2 refills | Status: DC

## 2016-11-25 MED ORDER — OMEPRAZOLE 20 MG PO CPDR: 20.0000 mg | DELAYED_RELEASE_CAPSULE | Freq: Every day | ORAL | 3 refills | Status: DC

## 2016-11-25 MED ORDER — NAPROXEN 500 MG PO TABS: 500.0000 mg | ORAL_TABLET | Freq: Two times a day (BID) | ORAL | 3 refills | Status: DC

## 2016-11-25 MED ORDER — CYCLOBENZAPRINE HCL 10 MG PO TABS: 10.0000 mg | ORAL_TABLET | Freq: Three times a day (TID) | ORAL | 0 refills | Status: DC | PRN

## 2016-11-25 MED ORDER — SUMATRIPTAN SUCCINATE 100 MG PO TABS: 100.0000 mg | ORAL_TABLET | Freq: Once | ORAL | 2 refills | Status: DC | PRN

## 2016-11-25 MED ORDER — LOSARTAN POTASSIUM 50 MG PO TABS: 50.0000 mg | ORAL_TABLET | Freq: Every day | ORAL | 5 refills | Status: DC

## 2016-11-25 NOTE — Progress Notes (Signed)
Patient ID: Jenna Jones MRN: [id number], DOB: 1976/01/02, 41 y.o. Date of Encounter: @  Chief Complaint:  Chief Complaint  Patient presents with  . Follow-up    is fasting  . Headache    x3 days- nausea, dizziness    HPI: 41 y.o. year old female     11/18/2016: presents as a New Patient to Establish Care.   She reports that "her dentist had her see an oral surgeon but they won't do surgery because [her] blood pressure is too high". Brings in dental xray was done and on this-- has a sticky note on it --with the date and blood pressure readings from that date. The date was 09/02/16. The blood pressure readings were 179/115, 187/108, 183/124, 181/127.  She has not had her blood pressure checked anywhere else to know how it has been running.  She also starts listing off other medical problems that she has had that she needs managed. States that she has mostly just been going to the urgent care when needed "or if has a really bad migraine" would go to the ER.  She mentions the following medical problems: Migraines, heartburn, and problems with sciatica--- says mostly goes down the left leg but occasionally down the right if sits a long time.  She mentions wanting to see Dr. Gerilyn Pilgrim regarding her migraines. I asked if she is on any medication for the migraines. States that she is not. She says that in the past she was on Imitrex, Flexeril, Paxil, Naprosyn and those worked well.  Regarding the heartburn-- says that she uses over-the-counter rimantadine for this.  She states that she last saw a gynecologist in 2001.  She makes reference to her husband being in [employer] so asked about this. She says that he was in the military 1987 - 2002 but then was discharged from the [employer] because of his asthma.  Asked if she works outside of the home. Her response is -- "No, she does not work-- that every time she's had a job, she either gets fired or has to quit."  Adds that she is a  high school dropout.  Later during the conversation she talks about the fact that her son has some form of Medicaid and that she has not been able to find any office that will accept his insurance/see him. (I am not accepting him as a new patient.)  Even with having such high blood pressure, she denies any associated symptoms. Is having no chest pressure or chest heaviness chest tightness. No increased shortness of breath or dyspnea on exertion. Has had no neurologic changes/neurologic deficits. No weakness in any extremity or slurred speech.    11/25/2016: She reports that she is taking the losartan daily. She does mention all the symptoms that she has been having and is asking if they may be side effects from the medicine and I told her no. She is naming off all types of aches and pains. She also brings up that she has reviewed where blood pressure medications were recently recalled and that she checked with the pharmacy and found out that all of those medicines that were recalled had been made in [place] and that her medicine is made here in the [place]--- I reassured her that this medication usually causes no adverse effects and certainly none of the symptoms that she is naming off and encouraged her to continue taking this daily.  Told her that today we would also address her acid reflux and  migraines.  She states that she has been taking multiple rinatadine every day trying to control her heartburn symptoms.  She reports that when she has her migraines they usually start on the left frontal area but then will eventually go to the right if they last long enough. States that when she has her migraines she sees variations of different colors/spots. Says that sometimes she develops vertigo with her migraines. Says that she's been having migraines since age 16 and that she has an aunt who has them every day. States that in the past she took Paxil on a daily basis and then also would use  Flexeril Naprosyn and Imitrex and that combination has worked well for her migraines in the past.  Today she also reports that she has been having bad cramps with her menses recently. Also states that for the last couple of months she is having dyspareunia. Reports that she has never felt that type of discomfort in the past. In the past 2 months it has been recurrent.   Past Medical History:  Diagnosis Date  . Dizziness    recurrent  . Migraine   . Sciatica   . Vertigo      Home Meds: Outpatient Medications Prior to Visit  Medication Sig Dispense Refill  . ibuprofen (ADVIL,MOTRIN) 200 MG tablet Take 1,600 mg by mouth daily as needed for headache or moderate pain.     Marland Kitchen losartan (COZAAR) 50 MG tablet Take 1 tablet (50 mg total) by mouth daily. 30 tablet 0   No facility-administered medications prior to visit.     Allergies:  Allergies  Allergen Reactions  . Codeine Itching    Social History   Social History  . Marital status: Married    Spouse name: N/A  . Number of children: N/A  . Years of education: N/A   Occupational History  . Not on file.   Social History Main Topics  . Smoking status: Current Every Day Smoker    Packs/day: 0.50    Years: 16.00    Types: Cigarettes  . Smokeless tobacco: Never Used  . Alcohol use No  . Drug use: No  . Sexual activity: Yes    Birth control/ protection: Surgical   Other Topics Concern  . Not on file   Social History Narrative  . No narrative on file    Family History  Problem Relation Age of Onset  . Cancer Other   . Diabetes Other   . Hypertension Other   . Thyroid disease Other      Review of Systems:  See HPI for pertinent ROS. All other ROS negative.    Physical Exam: Blood pressure (!) 142/88, pulse (!) 112, temperature 98.3 F (36.8 C), temperature source Oral, resp. rate 16, height  (1.549 m), weight 68.9 kg (152 lb), last menstrual period 11/12/2016, SpO2 98 %., Body mass index is 28.72  kg/m. General: WNWD WF. Appears in no acute distress. Neck: Supple. No thyromegaly. No lymphadenopathy. Lungs: Clear bilaterally to auscultation without wheezes, rales, or rhonchi. Breathing is unlabored. Heart: RRR with S1 S2. No murmurs, rubs, or gallops. Musculoskeletal:  Strength and tone normal for age. Extremities/Skin: Warm and dry. No edema. Neuro: Alert and oriented X 3. Moves all extremities spontaneously. Gait is normal. CNII-XII grossly in tact. Psych:  Responds to questions appropriately with a normal affect.     ASSESSMENT AND PLAN:  41 y.o. year old female with   1. Essential hypertension Blood pressure is much improved.  Will continue the losartan at 50 mg daily. Will check lab to monitor. - BASIC METABOLIC PANEL WITH GFR  2. Screening for HIV (human immunodeficiency virus) - HIV antibody  3. Migraine with aura and without status migrainosus, not intractable She is to start taking the Paxil daily. She will use the Flexeril and Naprosyn as needed. She will also use the Imitrex as directed. - PARoxetine (PAXIL) 20 MG tablet; Take 1 tablet (20 mg total) by mouth daily.  Dispense: 30 tablet; Refill: 2 - SUMAtriptan (IMITREX) 100 MG tablet; Take 1 tablet (100 mg total) by mouth once as needed for migraine. May repeat in 2 hours if headache persists or recurs.  Dispense: 9 tablet; Refill: 2 - cyclobenzaprine (FLEXERIL) 10 MG tablet; Take 1 tablet (10 mg total) by mouth 3 (three) times daily as needed for muscle spasms.  Dispense: 30 tablet; Refill: 0 - naproxen (NAPROSYN) 500 MG tablet; Take 1 tablet (500 mg total) by mouth 2 (two) times daily with a meal.  Dispense: 30 tablet; Refill: 3  4. Gastroesophageal reflux disease, esophagitis presence not specified Will add omeprazole to help control her GERD symptoms. - omeprazole (PRILOSEC) 20 MG capsule; Take 1 capsule (20 mg total) by mouth daily.  Dispense: 30 capsule; Refill: 3  5. Dyspareunia in female Will refer to GYN  for further evaluation. - Ambulatory referral to Obstetrics / Gynecology  6. Menstrual cramps Will refer to GYN for further evaluation. - Ambulatory referral to Obstetrics / Gynecology  Will plan f/u OV in 3 months, f/u sooner if needed.  At next OV will address:   sciatica, smoking----as well as following up the above.  Then will schedule her for complete physical exam to update preventative care.     Signed, [address] [place], [place], [facility] 11/25/2016 9:42 AM

## 2016-11-26 LAB — BASIC METABOLIC PANEL WITH GFR
BUN: 7 mg/dL (ref 7–25)
CALCIUM: 9.4 mg/dL (ref 8.6–10.2)
CHLORIDE: 106 mmol/L (ref 98–110)
CO2: 21 mmol/L (ref 20–32)
Creat: 0.8 mg/dL (ref 0.50–1.10)
GFR, Est African American: 105 mL/min/{1.73_m2} (ref >=60)
GFR, Est Non African American: 91 mL/min/{1.73_m2} (ref >=60)
GLUCOSE: 86 mg/dL (ref 65–99)
POTASSIUM: 4.5 mmol/L (ref 3.5–5.3)
Sodium: 139 mmol/L (ref 135–146)

## 2016-11-26 LAB — HIV ANTIBODY (ROUTINE TESTING W REFLEX): HIV 1&2 Ab, 4th Generation: NONREACTIVE

## 2016-11-29 ENCOUNTER — Encounter: Payer: Self-pay | Admitting: *Deleted

## 2016-12-08 ENCOUNTER — Encounter: Payer: Federal, State, Local not specified - PPO | Admitting: Adult Health

## 2016-12-20 ENCOUNTER — Other Ambulatory Visit: Payer: Self-pay | Admitting: Physician Assistant

## 2016-12-20 ENCOUNTER — Encounter: Payer: Federal, State, Local not specified - PPO | Admitting: Adult Health

## 2016-12-20 DIAGNOSIS — G43109 Migraine with aura, not intractable, without status migrainosus: Secondary | ICD-10-CM

## 2016-12-21 NOTE — Telephone Encounter (Signed)
Refill appropriate 

## 2016-12-24 ENCOUNTER — Telehealth: Payer: Self-pay | Admitting: Physician Assistant

## 2016-12-24 DIAGNOSIS — G43109 Migraine with aura, not intractable, without status migrainosus: Secondary | ICD-10-CM

## 2016-12-24 MED ORDER — CYCLOBENZAPRINE HCL 10 MG PO TABS: ORAL_TABLET | ORAL | 0 refills | Status: DC

## 2016-12-24 NOTE — Telephone Encounter (Signed)
Pt needs refill on flexeril, sent to [facility] in [place].

## 2016-12-24 NOTE — Telephone Encounter (Signed)
rx sent to pharmacy

## 2017-01-04 ENCOUNTER — Other Ambulatory Visit: Payer: Self-pay

## 2017-01-04 ENCOUNTER — Encounter (INDEPENDENT_AMBULATORY_CARE_PROVIDER_SITE_OTHER): Payer: Self-pay

## 2017-01-04 ENCOUNTER — Ambulatory Visit: Payer: Federal, State, Local not specified - PPO | Admitting: Adult Health

## 2017-01-04 ENCOUNTER — Other Ambulatory Visit (HOSPITAL_COMMUNITY)
Admission: RE | Admit: 2017-01-04 | Discharge: 2017-01-04 | Disposition: A | Discharge status: Home / Self Care | Payer: Federal, State, Local not specified - PPO | Source: Ambulatory Visit | Attending: Adult Health | Admitting: Adult Health

## 2017-01-04 ENCOUNTER — Encounter: Payer: Self-pay | Admitting: Adult Health

## 2017-01-04 VITALS — BP 152/96 | HR 111 | Ht 63.0 in | Wt 158.0 lb

## 2017-01-04 DIAGNOSIS — N941 Unspecified dyspareunia: Secondary | ICD-10-CM

## 2017-01-04 DIAGNOSIS — N6452 Nipple discharge: Secondary | ICD-10-CM

## 2017-01-04 DIAGNOSIS — N946 Dysmenorrhea, unspecified: Secondary | ICD-10-CM | POA: Diagnosis not present

## 2017-01-04 DIAGNOSIS — N920 Excessive and frequent menstruation with regular cycle: Secondary | ICD-10-CM

## 2017-01-04 NOTE — Addendum Note (Signed)
Addended by: Tish FredericksonLANCASTER, Saesha Llerenas A on: 01/04/2017 11:52 AM   Modules accepted: Orders
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
## 2017-01-05 ENCOUNTER — Telehealth: Payer: Self-pay | Admitting: Adult Health

## 2017-01-05 LAB — TSH: TSH: 1.11 u[IU]/mL (ref 0.450–4.500)

## 2017-01-05 LAB — PROLACTIN: Prolactin: 6.8 ng/mL (ref 4.8–23.3)

## 2017-01-05 NOTE — Telephone Encounter (Signed)
Pt aware prolactin and TSH normal

## 2017-01-07 ENCOUNTER — Telehealth: Payer: Self-pay | Admitting: Adult Health

## 2017-01-07 NOTE — Telephone Encounter (Signed)
Pt aware that nipple discharge showed no malignant cells

## 2017-01-11 ENCOUNTER — Ambulatory Visit (INDEPENDENT_AMBULATORY_CARE_PROVIDER_SITE_OTHER): Payer: Federal, State, Local not specified - PPO

## 2017-01-11 DIAGNOSIS — N920 Excessive and frequent menstruation with regular cycle: Secondary | ICD-10-CM

## 2017-01-11 DIAGNOSIS — N946 Dysmenorrhea, unspecified: Secondary | ICD-10-CM | POA: Diagnosis not present

## 2017-01-11 NOTE — Progress Notes (Signed)
PELVIC US TA/TV: homogeneous anteverted uterus,wnl,EEC 13 mm,mult small nabothain cysts,dominate septated follicle right ovary 1.7 x 1.6 x 1.6 cm,simple left ovarian cyst 2.5 x 1.8 x 1.5 cm,fluid filled tubular structure adjacent to left ovary 2.3 x 2.3 x 3.1 cm (? Hydrosalpinx),no free fluid,ovaries appear mobile,left adnexal pain during ultrasound

## 2017-01-12 ENCOUNTER — Telehealth: Payer: Self-pay | Admitting: Adult Health

## 2017-01-12 ENCOUNTER — Encounter: Payer: Self-pay | Admitting: Adult Health

## 2017-01-12 DIAGNOSIS — N7011 Chronic salpingitis: Secondary | ICD-10-CM

## 2017-01-12 HISTORY — DX: Chronic salpingitis: N70.11

## 2017-01-12 NOTE — Telephone Encounter (Signed)
Pt aware of US and hydrosalpinx will discuss further at appt.

## 2017-01-12 NOTE — Telephone Encounter (Signed)
Left message that I called.

## 2017-01-20 ENCOUNTER — Other Ambulatory Visit: Payer: Self-pay | Admitting: Adult Health

## 2017-01-20 ENCOUNTER — Encounter: Payer: Self-pay | Admitting: Adult Health

## 2017-01-20 ENCOUNTER — Other Ambulatory Visit (HOSPITAL_COMMUNITY)
Admission: RE | Admit: 2017-01-20 | Discharge: 2017-01-20 | Disposition: A | Discharge status: Home / Self Care | Payer: Federal, State, Local not specified - PPO | Source: Ambulatory Visit | Attending: Adult Health | Admitting: Adult Health

## 2017-01-20 ENCOUNTER — Ambulatory Visit: Payer: Federal, State, Local not specified - PPO | Admitting: Adult Health

## 2017-01-20 VITALS — BP 180/98 | HR 94 | Ht 62.0 in | Wt 154.5 lb

## 2017-01-20 DIAGNOSIS — I10 Essential (primary) hypertension: Secondary | ICD-10-CM

## 2017-01-20 DIAGNOSIS — Z1212 Encounter for screening for malignant neoplasm of rectum: Secondary | ICD-10-CM

## 2017-01-20 DIAGNOSIS — Z1231 Encounter for screening mammogram for malignant neoplasm of breast: Secondary | ICD-10-CM

## 2017-01-20 DIAGNOSIS — N816 Rectocele: Secondary | ICD-10-CM | POA: Insufficient documentation

## 2017-01-20 DIAGNOSIS — N7011 Chronic salpingitis: Secondary | ICD-10-CM

## 2017-01-20 DIAGNOSIS — R8290 Unspecified abnormal findings in urine: Secondary | ICD-10-CM | POA: Diagnosis not present

## 2017-01-20 DIAGNOSIS — Z01419 Encounter for gynecological examination (general) (routine) without abnormal findings: Secondary | ICD-10-CM | POA: Insufficient documentation

## 2017-01-20 DIAGNOSIS — N946 Dysmenorrhea, unspecified: Secondary | ICD-10-CM

## 2017-01-20 DIAGNOSIS — N920 Excessive and frequent menstruation with regular cycle: Secondary | ICD-10-CM

## 2017-01-20 DIAGNOSIS — Z1211 Encounter for screening for malignant neoplasm of colon: Secondary | ICD-10-CM

## 2017-01-20 LAB — HEMOCCULT GUIAC POC 1CARD (OFFICE): FECAL OCCULT BLD: NEGATIVE

## 2017-01-20 LAB — POCT URINALYSIS DIPSTICK
Blood, UA: NEGATIVE
Glucose, UA: NEGATIVE
Leukocytes, UA: NEGATIVE
NITRITE UA: NEGATIVE

## 2017-01-20 MED ORDER — KETOROLAC TROMETHAMINE 10 MG PO TABS: 10.0000 mg | ORAL_TABLET | Freq: Four times a day (QID) | ORAL | 0 refills | Status: DC | PRN

## 2017-01-20 MED ORDER — HYDROCHLOROTHIAZIDE 25 MG PO TABS: 25.0000 mg | ORAL_TABLET | Freq: Every day | ORAL | 12 refills | Status: DC

## 2017-01-20 NOTE — Progress Notes (Signed)
Patient ID: Jenna Jones, female   DOB: 09/01/1975, 41 y.o.   MRN: [id number] History of Present Illness: Jenna Jones is a 41 year old white female in for well woman gyn exam and pap. PCP is Allayne ButcherMary Dixon PA.   Current Medications, Allergies, Past Medical History, Past Surgical History, Family History and Social History were reviewed in [facility] electronic medical record.     Review of Systems: Patient denies any headaches, hearing loss, fatigue, blurred vision, shortness of breath, chest pain,  problems with bowel movements, urination, or intercourse. No joint pain or mood swings. +cramping and pain with periods and heavy periods.   Physical Exam:BP (!) 180/98 (BP Location: Left Arm, Patient Position: Sitting, Cuff Size: Normal)   Pulse 94   Ht 5\' 2"  (1.575 m)   Wt 154 lb 8 oz (70.1 kg)   LMP 01/01/2017 (Exact Date)   BMI 28.26 kg/m urine trace protein and ketones  General:  Well developed, well nourished, no acute distress Skin:  Warm and dry Neck:  Midline trachea, normal thyroid, good ROM, no lymphadenopathy Lungs; Clear to auscultation bilaterally Breast:  No dominant palpable mass, retraction, or nipple discharge Cardiovascular: Regular rate and rhythm Abdomen:  Soft, non tender, no hepatosplenomegaly Pelvic:  External genitalia is normal in appearance, no lesions.  The vagina is normal in appearance. Urethra has no lesions or masses. The cervix is bulbous,irregular at os,pap with GC/CHL and HPV performed.  Uterus is felt to be normal size, shape, and contour.  No adnexal masses, + tenderness LLQ  noted.Bladder is non tender, no masses felt. Rectal: Good sphincter tone, no polyps, or hemorrhoids felt.  Hemoccult negative.+rectocele Extremities/musculoskeletal:  No swelling or varicosities noted, no clubbing or cyanosis Psych:  No mood changes, alert and cooperative,seems happy PHQ 2 score 0.US showed left hydrosalpinx. Will add hydrodiuril for BP.  Impression: 1. Encounter for  gynecological examination with Papanicolaou smear of cervix   2. Hydrosalpinx   3. Abnormal urine odor   4. Menorrhagia with regular cycle   5. Screening for colorectal cancer   6. Dysmenorrhea   7. Essential hypertension   8. Rectocele       Plan: Check CBC,CMP, and lipids UA C&S sent [facility] 01/21/17 at 9 am Return in 1 week for pre op with Dr Aaron EdelmanFerguson,and recheck BP Physical in 1 year Pap in 3 if normal Meds ordered this encounter  Medications  . hydrochlorothiazide (HYDRODIURIL) 25 MG tablet    Sig: Take 1 tablet (25 mg total) by mouth daily.    Dispense:  30 tablet    Refill:  12    Order Specific Question:   Supervising Provider    Answer:   Despina HiddenEURE, LUTHER H [[id number]]  . ketorolac (TORADOL) 10 MG tablet    Sig: Take 1 tablet (10 mg total) by mouth every 6 (six) hours as needed.    Dispense:  20 tablet    Refill:  0    Order Specific Question:   Supervising Provider    Answer:   Duane LopeEURE, LUTHER H [[id number]]

## 2017-01-21 ENCOUNTER — Other Ambulatory Visit: Payer: Self-pay | Admitting: Physician Assistant

## 2017-01-21 ENCOUNTER — Ambulatory Visit (HOSPITAL_COMMUNITY)
Admission: RE | Admit: 2017-01-21 | Discharge: 2017-01-21 | Disposition: A | Discharge status: Home / Self Care | Payer: Federal, State, Local not specified - PPO | Source: Ambulatory Visit | Attending: Adult Health | Admitting: Adult Health

## 2017-01-21 DIAGNOSIS — Z1231 Encounter for screening mammogram for malignant neoplasm of breast: Secondary | ICD-10-CM | POA: Insufficient documentation

## 2017-01-21 DIAGNOSIS — K219 Gastro-esophageal reflux disease without esophagitis: Secondary | ICD-10-CM

## 2017-01-21 LAB — CBC
HEMATOCRIT: 35.5 % (ref 34.0–46.6)
Hemoglobin: 11.6 g/dL (ref 11.1–15.9)
MCH: 27.8 pg (ref 26.6–33.0)
MCHC: 32.7 g/dL (ref 31.5–35.7)
MCV: 85 fL (ref 79–97)
Platelets: 504 10*3/uL — ABNORMAL HIGH (ref 150–379)
RBC: 4.17 x10E6/uL (ref 3.77–5.28)
RDW: 14.4 % (ref 12.3–15.4)
WBC: 7.8 10*3/uL (ref 3.4–10.8)

## 2017-01-21 LAB — COMPREHENSIVE METABOLIC PANEL
A/G RATIO: 1.7 (ref 1.2–2.2)
ALK PHOS: 85 IU/L (ref 39–117)
ALT: 14 IU/L (ref 0–32)
AST: 20 IU/L (ref 0–40)
Albumin: 4.5 g/dL (ref 3.5–5.5)
BUN/Creatinine Ratio: 15 (ref 9–23)
BUN: 15 mg/dL (ref 6–24)
CO2: 18 mmol/L — ABNORMAL LOW (ref 20–29)
Calcium: 9.7 mg/dL (ref 8.7–10.2)
Chloride: 105 mmol/L (ref 96–106)
Creatinine, Ser: 0.97 mg/dL (ref 0.57–1.00)
GFR calc Af Amer: 83 mL/min/{1.73_m2} (ref >59)
GFR calc non Af Amer: 72 mL/min/{1.73_m2} (ref >59)
GLOBULIN, TOTAL: 2.6 g/dL (ref 1.5–4.5)
Glucose: 87 mg/dL (ref 65–99)
POTASSIUM: 4.1 mmol/L (ref 3.5–5.2)
SODIUM: 142 mmol/L (ref 134–144)
Total Protein: 7.1 g/dL (ref 6.0–8.5)

## 2017-01-21 LAB — MICROSCOPIC EXAMINATION: Casts: NONE SEEN /lpf

## 2017-01-21 LAB — URINALYSIS, ROUTINE W REFLEX MICROSCOPIC
BILIRUBIN UA: NEGATIVE
Glucose, UA: NEGATIVE
Leukocytes, UA: NEGATIVE
NITRITE UA: POSITIVE — AB
PH UA: 6 (ref 5.0–7.5)
Specific Gravity, UA: >=1.03 — AB (ref 1.005–1.030)
UUROB: 0.2 mg/dL (ref 0.2–1.0)

## 2017-01-21 LAB — CYTOLOGY - PAP
CHLAMYDIA, DNA PROBE: NEGATIVE
DIAGNOSIS: NEGATIVE
HPV: NOT DETECTED
Neisseria Gonorrhea: NEGATIVE

## 2017-01-21 LAB — LIPID PANEL
CHOLESTEROL TOTAL: 226 mg/dL — AB (ref 100–199)
Chol/HDL Ratio: 5.4 ratio — ABNORMAL HIGH (ref 0.0–4.4)
HDL: 42 mg/dL (ref >39)
LDL Calculated: 144 mg/dL — ABNORMAL HIGH (ref 0–99)
TRIGLYCERIDES: 201 mg/dL — AB (ref 0–149)
VLDL Cholesterol Cal: 40 mg/dL (ref 5–40)

## 2017-01-23 LAB — URINE CULTURE

## 2017-01-24 ENCOUNTER — Telehealth: Payer: Self-pay | Admitting: Adult Health

## 2017-01-24 MED ORDER — NITROFURANTOIN MONOHYD MACRO 100 MG PO CAPS
100.0000 mg | ORAL_CAPSULE | Freq: Two times a day (BID) | ORAL | 0 refills | Status: DC
Start: 2017-01-24 — End: 2017-03-23

## 2017-01-24 NOTE — Telephone Encounter (Signed)
Pt aware of labs and that need to decrease carbs and fats and increase activity, pap was normal and urine +Ecoli, will Rx Macrobid and has pap 12/7 with Dr Emelda FearFerguson

## 2017-01-28 ENCOUNTER — Encounter: Payer: Federal, State, Local not specified - PPO | Admitting: Obstetrics and Gynecology

## 2017-02-21 ENCOUNTER — Encounter: Payer: Federal, State, Local not specified - PPO | Admitting: Obstetrics and Gynecology

## 2017-02-21 ENCOUNTER — Encounter: Payer: Self-pay | Admitting: Physician Assistant

## 2017-02-21 ENCOUNTER — Other Ambulatory Visit: Payer: Self-pay | Admitting: Physician Assistant

## 2017-02-21 DIAGNOSIS — G43109 Migraine with aura, not intractable, without status migrainosus: Secondary | ICD-10-CM

## 2017-02-24 ENCOUNTER — Ambulatory Visit: Payer: Federal, State, Local not specified - PPO | Admitting: Physician Assistant

## 2017-02-25 ENCOUNTER — Ambulatory Visit: Payer: Federal, State, Local not specified - PPO | Admitting: Obstetrics and Gynecology

## 2017-02-25 ENCOUNTER — Encounter (INDEPENDENT_AMBULATORY_CARE_PROVIDER_SITE_OTHER): Payer: Self-pay

## 2017-02-25 ENCOUNTER — Encounter: Payer: Self-pay | Admitting: Obstetrics and Gynecology

## 2017-02-25 VITALS — BP 120/80 | HR 95 | Ht 61.0 in | Wt 161.6 lb

## 2017-02-25 DIAGNOSIS — Z113 Encounter for screening for infections with a predominantly sexual mode of transmission: Secondary | ICD-10-CM | POA: Diagnosis not present

## 2017-02-25 MED ORDER — TRAMADOL HCL 50 MG PO TABS: 50.0000 mg | ORAL_TABLET | Freq: Four times a day (QID) | ORAL | 0 refills | Status: DC | PRN
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
## 2017-02-28 ENCOUNTER — Ambulatory Visit: Payer: Federal, State, Local not specified - PPO | Admitting: Physician Assistant

## 2017-02-28 LAB — GC/CHLAMYDIA PROBE AMP
Chlamydia trachomatis, NAA: NEGATIVE
Neisseria gonorrhoeae by PCR: NEGATIVE

## 2017-03-01 ENCOUNTER — Telehealth: Payer: Self-pay | Admitting: *Deleted

## 2017-03-01 NOTE — Progress Notes (Signed)
Pt.notified

## 2017-03-01 NOTE — Telephone Encounter (Signed)
Pt informed STD testing was negative.

## 2017-03-02 ENCOUNTER — Encounter: Payer: Self-pay | Admitting: Physician Assistant

## 2017-03-07 ENCOUNTER — Ambulatory Visit: Payer: Federal, State, Local not specified - PPO | Admitting: Physician Assistant

## 2017-03-11 ENCOUNTER — Ambulatory Visit: Payer: Federal, State, Local not specified - PPO | Admitting: Obstetrics and Gynecology

## 2017-03-15 ENCOUNTER — Telehealth: Payer: Self-pay | Admitting: Obstetrics and Gynecology

## 2017-03-15 NOTE — Telephone Encounter (Signed)
Patient's office visit of 02/25/17 incompletely documented. Unable to recreate at this time.  Pt called , and apologies offered for incomplete f/u. Pt states that there are some issues with her insurance , and that she cancelled f/u appt due to that. Pt offered appt later this week to clarify and update care planning.

## 2017-03-21 ENCOUNTER — Encounter: Payer: Self-pay | Admitting: Physician Assistant

## 2017-03-23 ENCOUNTER — Encounter: Payer: Self-pay | Admitting: Obstetrics and Gynecology

## 2017-03-23 ENCOUNTER — Ambulatory Visit (INDEPENDENT_AMBULATORY_CARE_PROVIDER_SITE_OTHER): Payer: Federal, State, Local not specified - PPO | Admitting: Obstetrics and Gynecology

## 2017-03-23 VITALS — BP 136/82 | HR 108 | Ht 61.0 in | Wt 170.0 lb

## 2017-03-23 DIAGNOSIS — N816 Rectocele: Secondary | ICD-10-CM | POA: Diagnosis not present

## 2017-03-23 DIAGNOSIS — N941 Unspecified dyspareunia: Secondary | ICD-10-CM

## 2017-03-23 DIAGNOSIS — G8929 Other chronic pain: Secondary | ICD-10-CM

## 2017-03-23 DIAGNOSIS — N8111 Cystocele, midline: Secondary | ICD-10-CM | POA: Insufficient documentation

## 2017-03-23 DIAGNOSIS — R102 Pelvic and perineal pain: Secondary | ICD-10-CM | POA: Diagnosis not present

## 2017-03-23 DIAGNOSIS — N7011 Chronic salpingitis: Secondary | ICD-10-CM | POA: Diagnosis not present

## 2017-03-23 DIAGNOSIS — N83291 Other ovarian cyst, right side: Secondary | ICD-10-CM | POA: Diagnosis not present
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
## 2017-03-28 NOTE — Patient Instructions (Signed)
Jenna Jones  03/28/2017     @PREFPERIOPPHARMACY @   Your procedure is scheduled on  04/05/2017   Report to [facility] at  735   A.M.  Call this number if you have problems the morning of surgery:  [phone]   Remember:  Do not eat food or drink liquids after midnight.  Take these medicines the morning of surgery with A SIP OF WATER losartan, prilosec, paxil.   Do not wear jewelry, make-up or nail polish.  Do not wear lotions, powders, or perfumes, or deodorant.  Do not shave 48 hours prior to surgery.  Men may shave face and neck.  Do not bring valuables to the hospital.  [facility] is not responsible for any belongings or valuables.  Contacts, dentures or bridgework may not be worn into surgery.  Leave your suitcase in the car.  After surgery it may be brought to your room.  For patients admitted to the hospital, discharge time will be determined by your treatment team.  Patients discharged the day of surgery will not be allowed to drive home.   Name and phone number of your driver:   family Special instructions:  Follow the diet and prep instructions given to you by Dr Emelda Fear- enclosed.  Please read over the following fact sheets that you were given. Anesthesia Post-op Instructions and Care and Recovery After Surgery      Anterior and Posterior Colporrhaphy and Sling Procedure, Care After This sheet gives you information about how to care for yourself after your procedure. Your health care provider may also give you more specific instructions. If you have problems or questions, contact your health care provider. What can I expect after the procedure? After the procedure, it is common to have:  Pain in the surgical area.  Vaginal discharge. You will need to use a sanitary pad during this time.  Fatigue.  Follow these instructions at home: Incision care  Follow instructions from your health care provider about how to take care of  your incision. Make sure you: ? Wash your hands with soap and water before touching the incision area. If soap and water are not available, use hand sanitizer. ? Clean your incision as told by your health care provider. ? Leave stitches (sutures), skin glue, or adhesive strips in place. These skin closures may need to stay in place for 2 weeks or longer. If adhesive strip edges start to loosen and curl up, you may trim the loose edges. Do not remove adhesive strips completely unless your health care provider tells you to do that.  Check your incision area every day for signs of infection. Check for: ? Redness, swelling, or pain. ? Fluid or blood. ? Warmth. ? Pus or a bad smell.  Check your incision every day to make sure the incision area is not separating or opening.  Do not take baths, swim, or use a hot tub until your health care provider approves. You may shower.  Keep the area between your vagina and rectum (perineal area) clean and dry. Make sure you clean the area after each bowel movement and each time you urinate.  Ask your health care provider if you can take a sitz bath or sit in a tub of clean, warm water. Activity  Do gentle, daily activity as told by your health care provider. You may be told to take short walks every day and  go farther each time. Ask your health care provider what activities are safe for you.  Limit stair climbing to once or twice a day in the first week, then slowly increase this activity.  Do not lift anything that is heavier than 10 lbs. (4.5 kg), or the limit that your health care provider tells you, until he or she says that it is safe. Avoid pushing or pulling motions.  Avoid standing for long periods of time.  Do not douche, use tampons, or have sex until your health care provider says it is okay.  Do not drive or use heavy machinery while taking prescription pain medicine. To prevent constipation  To prevent or treat constipation while you are  taking prescription pain medicine, your health care provider may recommend that you: ? Take over-the-counter or prescription medicines. ? Eat foods that are high in fiber, such as fresh fruits and vegetables, whole grains, and beans. ? Drink enough fluid to keep your urine clear or pale yellow. ? Limit foods that are high in fat and processed sugars, such as fried and sweet foods. General instructions  You may be instructed to do pelvic floor exercises (kegels) as told by your health care provider.  Take over-the-counter and prescription medicines only as told by your health care provider.  Keep all follow-up visits as told by your health care provider. This is important. Contact a health care provider if:  Medicine does not help your pain.  You have frequent or urgent urination, or you are unable to completely empty your bladder.  You feel a burning sensation when urinating.  You have fluid or blood coming from your incision.  You have pus or a bad smell coming from the incision.  Your incision feels warm to the touch.  You have redness, swelling, or pain around your incision. Get help right away if:  You have a fever or chills.  Your incision separates or opens.  You cannot urinate.  You have trouble breathing. Summary  After the procedure, it is common to have pain, fatigue, and discharge from the vagina.  Keep the area between your vagina and rectum (perineal area) clean and dry. Make sure you clean the area after each bowel movement and each time you urinate.  Follow instructions from your health care provider on any activity restrictions after the procedure. This information is not intended to replace advice given to you by your health care provider. Make sure you discuss any questions you have with your health care provider. Document Released: 09/23/2003 Document Revised: 02/09/2016 Document Reviewed: 02/09/2016 Elsevier Interactive Patient Education  2017  [employer] About Rectocele  Overview  A rectocele is a type of hernia which causes different degrees of bulging of the rectal tissues into the vaginal wall.  You may even notice that it presses against the vaginal wall so much that some vaginal tissues droop outside of the opening of your vagina.  Causes of Rectocele  The most common cause is childbirth.  The muscles and ligaments in the pelvis that hold up and support the female organs and vagina become stretched and weakened during labor and delivery.  The more babies you have, the more the support tissues are stretched and weakened.  Not everyone who has a baby will develop a rectocele.  Some women have stronger supporting tissue in the pelvis and may not have as much of a problem as others.  Women who have a Cesarean section usually do not get rectocele's unless they pushed  a long time prior to the cesarean delivery.  Other conditions that can cause a rectocele include chronic constipation, a chronic cough, a lot of heavy lifting, and obesity.  Older women may have this problem because the loss of female hormones causes the vaginal tissue to become weaker.  Symptoms  There may not be any symptoms.  If you do have symptoms, they may include:  Pelvic pressure in the rectal area  Protrusion of the lower part of the vagina through the opening of the vagina  Constipation and trapping of the stool, making it difficult to have a bowel movement.  In severe cases, you may have to press on the lower part of your vagina to help push the stool out of you rectum.  This is called splinting to empty.  Diagnosing Rectocele  Your health care provider will ask about your symptoms and perform a pelvic exam.  S/he will ask you to bear down, pushing like you are having a bowel movement so as to see how far the lower part of the vagina protrudes into the vagina and possible outside of the vagina.  Your provider will also ask you to contract the muscles of  your pelvis (like you are stopping the stream in the middle of urinating) to determine the strength of your pelvic muscles.  Your provider may also do a rectal exam.  Treatment Options  If you do not have any symptoms, no treatment may be necessary.  Other treatment options include:  Pelvic floor exercises: Contracting the muscles in your genital area may help strengthen your muscles and support the organs.  Be sure to get proper exercise instruction from you physical therapist.  A pessary (removealbe pelvic support device) sometimes helps rectocele symptoms.  Surgery: Surgical repair may be necessary. In some cases the uterus may need to be taken out ( a hysterectomy) as well.  There are many types of surgery for pelvic support problems.  Look for physicians who specialize in repair procedures.  You can take care of yourself by:  Treating and preventing constipation  Avoiding heavy lifting, and lifting correctly (with your legs, not with you waist or back)  Treating a chronic cough or bronchitis  Not smoking  avoiding too much weight gain  Doing pelvic floor exercises   2007, [employer] Doc.33 Salpingectomy Salpingectomy, also called tubectomy, is the surgical removal of one of the fallopian tubes. The fallopian tubes are where eggs travel from the ovaries to the uterus. Removing one fallopian tube does not prevent you from becoming pregnant. It also does not cause problems with your menstrual periods. You may need a salpingectomy if you:  Have a fertilized egg that attaches to the fallopian tube (ectopic pregnancy), especially one that causes the tube to burst or tear (rupture).  Have an infected fallopian tube.  Have cancer of the fallopian tube or nearby organs.  Have had an ovary removed due to a cyst or tumor.  Have had your uterus removed.  There are three different methods that can be used for a salpingectomy:  Open. This method involves making one  large incision in your abdomen.  Laparoscopic. This method involves using a thin, lighted tube with a tiny camera on the end (laparoscope) to help perform the procedure. The laparoscope will allow your surgeon to make several small incisions in the abdomen instead of a large incision.  Robot-assisted: This method involves using a computer to control surgical instruments that are attached to robotic arms.  Tell  a health care provider about:  Any allergies you have.  All medicines you are taking, including vitamins, herbs, eye drops, creams, and over-the-counter medicines.  Any problems you or family members have had with anesthetic medicines.  Any blood disorders you have.  Any surgeries you have had.  Any medical conditions you have.  Whether you are pregnant or may be pregnant. What are the risks? Generally, this is a safe procedure. However, problems may occur, including:  Infection.  Bleeding.  Allergic reactions to medicines.  Damage to other structures or organs.  Blood clots in the legs or lungs.  What happens before the procedure? Staying hydrated Follow instructions from your health care provider about hydration, which may include:  Up to 2 hours before the procedure - you may continue to drink clear liquids, such as water, clear fruit juice, black coffee, and plain tea.  Eating and drinking restrictions Follow instructions from your health care provider about eating and drinking, which may include:  8 hours before the procedure - stop eating heavy meals or foods such as meat, fried foods, or fatty foods.  6 hours before the procedure - stop eating light meals or foods, such as toast or cereal.  6 hours before the procedure - stop drinking milk or drinks that contain milk.  2 hours before the procedure - stop drinking clear liquids.  Medicines  Ask your health care provider about: ? Changing or stopping your regular medicines. This is especially important  if you are taking diabetes medicines or blood thinners. ? Taking medicines such as aspirin and ibuprofen. These medicines can thin your blood. Do not take these medicines before your procedure if your health care provider instructs you not to.  You may be given antibiotic medicine to help prevent infection. General instructions  Do not smoke for at least 2 weeks before your procedure. If you need help quitting, ask your health care provider.  You may have an exam or tests, such as an electrocardiogram (ECG).  You may have a blood or urine sample taken.  Ask your health care provider: ? Whether you should stop removing hair from your surgical area. ? How your surgical site will be marked or identified.  You may be asked to shower with a germ-killing soap.  Plan to have someone take you home from the hospital or clinic.  If you will be going home right after the procedure, plan to have someone with you for 24 hours. What happens during the procedure?  To reduce your risk of infection: ? Your health care team will wash or sanitize their hands. ? Hair may be removed from the surgical area. ? Your skin will be washed with soap.  An IV tube will be inserted into one of your veins.  You will be given a medicine to make you fall asleep (general anesthetic). You may also be given a medicine to help you relax (sedative).  A thin tube (catheter) may be inserted through your urethra and into your bladder to drain urine during your procedure.  Depending on the type of procedure you are having, one incision or several small incisions will be made in your abdomen.  Your fallopian tube will be cut and removed from where it attaches to your uterus.  Your blood vessels will be clamped and tied to prevent excess bleeding.  The incision(s) in your abdomen will be closed with stitches (sutures), staples, or skin glue.  A bandage (dressing) may be placed over your incision(s).  The procedure may  vary among health care providers and hospitals. What happens after the procedure?  Your blood pressure, heart rate, breathing rate, and blood oxygen level will be monitored until the medicines you were given have worn off.  You may continue to receive fluids and medicines through an IV tube.  You may continue to have a catheter draining your urine.  You may have to wear compression stockings. These stockings help to prevent blood clots and reduce swelling in your legs.  You will be given pain medicine as needed.  Do not drive for 24 hours if you received a sedative. Summary  Salpingectomy is a surgical procedure to remove one of the fallopian tubes.  The procedure may be done with an open incision, with a laparoscope, or with computer-controlled instruments.  Depending on the type of procedure you are having, one incision or several small incisions will be made in your abdomen.  Your blood pressure, heart rate, breathing rate, and blood oxygen level will be monitored until the medicines you were given have worn off.  Plan to have someone take you home from the hospital or clinic. This information is not intended to replace advice given to you by your health care provider. Make sure you discuss any questions you have with your health care provider. Document Released: 06/27/2008 Document Revised: 09/26/2015 Document Reviewed: 08/02/2012 Elsevier Interactive Patient Education  2018 [employer]. Laparoscopically Assisted Vaginal Hysterectomy A laparoscopically assisted vaginal hysterectomy (LAVH) is a surgical procedure to remove the uterus and cervix, and sometimes the ovaries and fallopian tubes. During an LAVH, some of the surgical removal is done through the vagina, and the rest is done through a few small surgical cuts (incisions) in the abdomen. This procedure is usually considered in women when a vaginal hysterectomy is not an option. Your health care provider will discuss the  risks and benefits of the different surgical techniques at your appointment. Generally, recovery time is faster and there are fewer complications after laparoscopic procedures than after open incisional procedures. Tell a health care provider about:  Any allergies you have.  All medicines you are taking, including vitamins, herbs, eye drops, creams, and over-the-counter medicines.  Any problems you or family members have had with anesthetic medicines.  Any blood disorders you have.  Any surgeries you have had.  Any medical conditions you have. What are the risks? Generally, this is a safe procedure. However, as with any procedure, complications can occur. Possible complications include:  Allergies to medicines.  Difficulty breathing.  Bleeding.  Infection.  Damage to other structures near your uterus and cervix.  What happens before the procedure?  Ask your health care provider about changing or stopping your regular medicines.  Take certain medicines, such as a colon-emptying preparation, as directed.  Do not eat or drink anything for at least 8 hours before your surgery.  Stop smoking if you smoke. Stopping will improve your health after surgery.  Arrange for a ride home after surgery and for help at home during recovery. What happens during the procedure?  An IV tube will be put into one of your veins in order to give you fluids and medicines.  You will receive medicines to relax you and medicines that make you sleep (general anesthetic).  You may have a flexible tube (catheter) put into your bladder to drain urine.  You may have a tube put through your nose or mouth that goes into your stomach (nasogastric tube). The nasogastric tube removes digestive  fluids and prevents you from feeling nauseated and from vomiting.  Tight-fitting (compression) stockings will be placed on your legs to promote circulation.  Three to four small incisions will be made in your  abdomen. An incision also will be made in your vagina. Probes and tools will be inserted into the small incisions. The uterus and cervix are removed (and possibly your ovaries and fallopian tubes) through your vagina as well as through the small incisions that were made in the abdomen.  Your vagina is then sewn back to normal. What happens after the procedure?  You may have a liquid diet temporarily. You will most likely return to, and tolerate, your usual diet the day after surgery.  You will be passing urine through a catheter. It will be removed the day after surgery.  Your temperature, breathing rate, heart rate, blood pressure, and oxygen level will be monitored regularly.  You will still wear compression stockings on your legs until you are able to move around.  You will use a special device or do breathing exercises to keep your lungs clear.  You will be encouraged to walk as soon as possible. This information is not intended to replace advice given to you by your health care provider. Make sure you discuss any questions you have with your health care provider. Document Released: 01/28/2011 Document Revised: 07/17/2015 Document Reviewed: 08/24/2012 Elsevier Interactive Patient Education  2018 Elsevier Inc. Laparoscopically Assisted Vaginal Hysterectomy, Care After Refer to this sheet in the next few weeks. These instructions provide you with information on caring for yourself after your procedure. Your health care provider may also give you more specific instructions. Your treatment has been planned according to current medical practices, but problems sometimes occur. Call your health care provider if you have any problems or questions after your procedure. What can I expect after the procedure? After your procedure, it is typical to have the following:  Abdominal pain. You will be given pain medicine to control it.  Sore throat from the breathing tube that was inserted during  surgery.  Follow these instructions at home:  Only take over-the-counter or prescription medicines for pain, discomfort, or fever as directed by your health care provider.  Do not take aspirin. It can cause bleeding.  Do not drive when taking pain medicine.  Follow your health care provider's advice regarding diet, exercise, lifting, driving, and general activities.  Resume your usual diet as directed and allowed.  Get plenty of rest and sleep.  Do not douche, use tampons, or have sexual intercourse for at least 6 weeks, or until your health care provider gives you permission.  Change your bandages (dressings) as directed by your health care provider.  Monitor your temperature and notify your health care provider of a fever.  Take showers instead of baths for 2-3 weeks.  Do not drink alcohol until your health care provider gives you permission.  If you develop constipation, you may take a mild laxative with your health care provider's permission. Bran foods may help with constipation problems. Drinking enough fluids to keep your urine clear or pale yellow may help as well.  Try to have someone home with you for 1-2 weeks to help around the house.  Keep all of your follow-up appointments as directed by your health care provider. Contact a health care provider if:  You have swelling, redness, or increasing pain around your incision sites.  You have pus coming from your incision.  You notice a bad smell coming  from your incision.  Your incision breaks open.  You feel dizzy or lightheaded.  You have pain or bleeding when you urinate.  You have persistent diarrhea.  You have persistent nausea and vomiting.  You have abnormal vaginal discharge.  You have a rash.  You have any type of abnormal reaction or develop an allergy to your medicine.  You have poor pain control with your prescribed medicine. Get help right away if:  You have a fever.  You have severe  abdominal pain.  You have chest pain.  You have shortness of breath.  You faint.  You have pain, swelling, or redness in your leg.  You have heavy vaginal bleeding with blood clots. This information is not intended to replace advice given to you by your health care provider. Make sure you discuss any questions you have with your health care provider. Document Released: 01/28/2011 Document Revised: 07/17/2015 Document Reviewed: 08/24/2012 Elsevier Interactive Patient Education  2017 Elsevier Inc.  General Anesthesia, Adult General anesthesia is the use of medicines to make a person "go to sleep" (be unconscious) for a medical procedure. General anesthesia is often recommended when a procedure:  Is long.  Requires you to be still or in an unusual position.  Is major and can cause you to lose blood.  Is impossible to do without general anesthesia.  The medicines used for general anesthesia are called general anesthetics. In addition to making you sleep, the medicines:  Prevent pain.  Control your blood pressure.  Relax your muscles.  Tell a health care provider about:  Any allergies you have.  All medicines you are taking, including vitamins, herbs, eye drops, creams, and over-the-counter medicines.  Any problems you or family members have had with anesthetic medicines.  Types of anesthetics you have had in the past.  Any bleeding disorders you have.  Any surgeries you have had.  Any medical conditions you have.  Any history of heart or lung conditions, such as heart failure, sleep apnea, or chronic obstructive pulmonary disease (COPD).  Whether you are pregnant or may be pregnant.  Whether you use tobacco, alcohol, marijuana, or street drugs.  Any history of [employer].  Any history of depression or anxiety. What are the risks? Generally, this is a safe procedure. However, problems may occur, including:  Allergic reaction to anesthetics.  Lung and  heart problems.  Inhaling food or liquids from your stomach into your lungs (aspiration).  Injury to nerves.  Waking up during your procedure and being unable to move (rare).  Extreme agitation or a state of mental confusion (delirium) when you wake up from the anesthetic.  Air in the bloodstream, which can lead to stroke.  These problems are more likely to develop if you are having a major surgery or if you have an advanced medical condition. You can prevent some of these complications by answering all of your health care provider's questions thoroughly and by following all pre-procedure instructions. General anesthesia can cause side effects, including:  Nausea or vomiting  A sore throat from the breathing tube.  Feeling cold or shivery.  Feeling tired, washed out, or achy.  Sleepiness or drowsiness.  Confusion or agitation.  What happens before the procedure? Staying hydrated Follow instructions from your health care provider about hydration, which may include:  Up to 2 hours before the procedure - you may continue to drink clear liquids, such as water, clear fruit juice, black coffee, and plain tea.  Eating and drinking restrictions Follow  instructions from your health care provider about eating and drinking, which may include:  8 hours before the procedure - stop eating heavy meals or foods such as meat, fried foods, or fatty foods.  6 hours before the procedure - stop eating light meals or foods, such as toast or cereal.  6 hours before the procedure - stop drinking milk or drinks that contain milk.  2 hours before the procedure - stop drinking clear liquids.  Medicines  Ask your health care provider about: ? Changing or stopping your regular medicines. This is especially important if you are taking diabetes medicines or blood thinners. ? Taking medicines such as aspirin and ibuprofen. These medicines can thin your blood. Do not take these medicines before your  procedure if your health care provider instructs you not to. ? Taking new dietary supplements or medicines. Do not take these during the week before your procedure unless your health care provider approves them.  If you are told to take a medicine or to continue taking a medicine on the day of the procedure, take the medicine with sips of water. General instructions   Ask if you will be going home the same day, the following day, or after a longer hospital stay. ? Plan to have someone take you home. ? Plan to have someone stay with you for the first 24 hours after you leave the hospital or clinic.  For 3-6 weeks before the procedure, try not to use any tobacco products, such as cigarettes, chewing tobacco, and e-cigarettes.  You may brush your teeth on the morning of the procedure, but make sure to spit out the toothpaste. What happens during the procedure?  You will be given anesthetics through a mask and through an IV tube in one of your veins.  You may receive medicine to help you relax (sedative).  As soon as you are asleep, a breathing tube may be used to help you breathe.  An anesthesia specialist will stay with you throughout the procedure. He or she will help keep you comfortable and safe by continuing to give you medicines and adjusting the amount of medicine that you get. He or she will also watch your blood pressure, pulse, and oxygen levels to make sure that the anesthetics do not cause any problems.  If a breathing tube was used to help you breathe, it will be removed before you wake up. The procedure may vary among health care providers and hospitals. What happens after the procedure?  You will wake up, often slowly, after the procedure is complete, usually in a recovery area.  Your blood pressure, heart rate, breathing rate, and blood oxygen level will be monitored until the medicines you were given have worn off.  You may be given medicine to help you calm down if you  feel anxious or agitated.  If you will be going home the same day, your health care provider may check to make sure you can stand, drink, and urinate.  Your health care providers will treat your pain and side effects before you go home.  Do not drive for 24 hours if you received a sedative.  You may: ? Feel nauseous and vomit. ? Have a sore throat. ? Have mental slowness. ? Feel cold or shivery. ? Feel sleepy. ? Feel tired. ? Feel sore or achy, even in parts of your body where you did not have surgery. This information is not intended to replace advice given to you by your health care  provider. Make sure you discuss any questions you have with your health care provider. Document Released: 05/18/2007 Document Revised: 07/22/2015 Document Reviewed: 01/23/2015 Elsevier Interactive Patient Education  2018 [employer]. General Anesthesia, Adult, Care After These instructions provide you with information about caring for yourself after your procedure. Your health care provider may also give you more specific instructions. Your treatment has been planned according to current medical practices, but problems sometimes occur. Call your health care provider if you have any problems or questions after your procedure. What can I expect after the procedure? After the procedure, it is common to have:  Vomiting.  A sore throat.  Mental slowness.  It is common to feel:  Nauseous.  Cold or shivery.  Sleepy.  Tired.  Sore or achy, even in parts of your body where you did not have surgery.  Follow these instructions at home: For at least 24 hours after the procedure:  Do not: ? Participate in activities where you could fall or become injured. ? Drive. ? Use heavy machinery. ? Drink alcohol. ? Take sleeping pills or medicines that cause drowsiness. ? Make important decisions or sign legal documents. ? Take care of children on your own.  Rest. Eating and drinking  If you vomit,  drink water, juice, or soup when you can drink without vomiting.  Drink enough fluid to keep your urine clear or pale yellow.  Make sure you have little or no nausea before eating solid foods.  Follow the diet recommended by your health care provider. General instructions  Have a responsible adult stay with you until you are awake and alert.  Return to your normal activities as told by your health care provider. Ask your health care provider what activities are safe for you.  Take over-the-counter and prescription medicines only as told by your health care provider.  If you smoke, do not smoke without supervision.  Keep all follow-up visits as told by your health care provider. This is important. Contact a health care provider if:  You continue to have nausea or vomiting at home, and medicines are not helpful.  You cannot drink fluids or start eating again.  You cannot urinate after 8-12 hours.  You develop a skin rash.  You have fever.  You have increasing redness at the site of your procedure. Get help right away if:  You have difficulty breathing.  You have chest pain.  You have unexpected bleeding.  You feel that you are having a life-threatening or urgent problem. This information is not intended to replace advice given to you by your health care provider. Make sure you discuss any questions you have with your health care provider. Document Released: 05/17/2000 Document Revised: 07/14/2015 Document Reviewed: 01/23/2015 Elsevier Interactive Patient Education  [employer].

## 2017-03-31 ENCOUNTER — Other Ambulatory Visit: Payer: Self-pay

## 2017-03-31 ENCOUNTER — Other Ambulatory Visit: Payer: Self-pay | Admitting: Obstetrics and Gynecology

## 2017-03-31 ENCOUNTER — Encounter (HOSPITAL_COMMUNITY)
Admission: RE | Admit: 2017-03-31 | Discharge: 2017-03-31 | Disposition: A | Discharge status: Home / Self Care | Payer: Federal, State, Local not specified - PPO | Source: Ambulatory Visit | Attending: Obstetrics and Gynecology | Admitting: Obstetrics and Gynecology

## 2017-03-31 ENCOUNTER — Encounter (HOSPITAL_COMMUNITY): Payer: Self-pay

## 2017-03-31 DIAGNOSIS — Z01812 Encounter for preprocedural laboratory examination: Secondary | ICD-10-CM | POA: Diagnosis not present

## 2017-03-31 HISTORY — DX: Gastro-esophageal reflux disease without esophagitis: K21.9

## 2017-03-31 HISTORY — DX: Major depressive disorder, single episode, unspecified: F32.9

## 2017-03-31 HISTORY — DX: Anxiety disorder, unspecified: F41.9

## 2017-03-31 HISTORY — DX: Depression, unspecified: F32.A

## 2017-03-31 LAB — COMPREHENSIVE METABOLIC PANEL
ALBUMIN: 3.9 g/dL (ref 3.5–5.0)
ALT: 19 U/L (ref 14–54)
AST: 31 U/L (ref 15–41)
Alkaline Phosphatase: 67 U/L (ref 38–126)
Anion gap: 14 (ref 5–15)
BUN: 18 mg/dL (ref 6–20)
CHLORIDE: 102 mmol/L (ref 101–111)
CO2: 18 mmol/L — ABNORMAL LOW (ref 22–32)
Calcium: 9.3 mg/dL (ref 8.9–10.3)
Creatinine, Ser: 0.92 mg/dL (ref 0.44–1.00)
GFR calc Af Amer: >60 mL/min (ref >60)
GFR calc non Af Amer: >60 mL/min (ref >60)
GLUCOSE: 127 mg/dL — AB (ref 65–99)
POTASSIUM: 3.5 mmol/L (ref 3.5–5.1)
SODIUM: 134 mmol/L — AB (ref 135–145)
Total Bilirubin: 0.3 mg/dL (ref 0.3–1.2)
Total Protein: 7.8 g/dL (ref 6.5–8.1)

## 2017-03-31 LAB — HCG, SERUM, QUALITATIVE: Preg, Serum: NEGATIVE

## 2017-03-31 LAB — TYPE AND SCREEN
ABO/RH(D): O POS
Antibody Screen: NEGATIVE

## 2017-03-31 LAB — CBC
HEMATOCRIT: 35 % — AB (ref 36.0–46.0)
Hemoglobin: 11.1 g/dL — ABNORMAL LOW (ref 12.0–15.0)
MCH: 26.6 pg (ref 26.0–34.0)
MCHC: 31.7 g/dL (ref 30.0–36.0)
MCV: 83.7 fL (ref 78.0–100.0)
PLATELETS: 592 10*3/uL — AB (ref 150–400)
RBC: 4.18 MIL/uL (ref 3.87–5.11)
RDW: 16.6 % — AB (ref 11.5–15.5)
WBC: 8.8 10*3/uL (ref 4.0–10.5)

## 2017-04-05 ENCOUNTER — Encounter (HOSPITAL_COMMUNITY)
Admission: RE | Disposition: A | Discharge status: Home / Self Care | Payer: Self-pay | Source: Ambulatory Visit | Attending: Obstetrics and Gynecology

## 2017-04-05 ENCOUNTER — Other Ambulatory Visit: Payer: Self-pay

## 2017-04-05 ENCOUNTER — Ambulatory Visit (HOSPITAL_COMMUNITY): Payer: Federal, State, Local not specified - PPO | Admitting: Anesthesiology

## 2017-04-05 ENCOUNTER — Observation Stay (HOSPITAL_COMMUNITY)
Admission: RE | Admit: 2017-04-05 | Discharge: 2017-04-06 | Disposition: A | Discharge status: Home / Self Care | Payer: Federal, State, Local not specified - PPO | Source: Ambulatory Visit | Attending: Obstetrics and Gynecology | Admitting: Obstetrics and Gynecology

## 2017-04-05 ENCOUNTER — Encounter (HOSPITAL_COMMUNITY): Payer: Self-pay | Admitting: *Deleted

## 2017-04-05 DIAGNOSIS — N888 Other specified noninflammatory disorders of cervix uteri: Secondary | ICD-10-CM | POA: Diagnosis not present

## 2017-04-05 DIAGNOSIS — N816 Rectocele: Secondary | ICD-10-CM | POA: Diagnosis not present

## 2017-04-05 DIAGNOSIS — N941 Unspecified dyspareunia: Secondary | ICD-10-CM | POA: Diagnosis not present

## 2017-04-05 DIAGNOSIS — R102 Pelvic and perineal pain: Secondary | ICD-10-CM | POA: Diagnosis present

## 2017-04-05 DIAGNOSIS — N838 Other noninflammatory disorders of ovary, fallopian tube and broad ligament: Secondary | ICD-10-CM | POA: Insufficient documentation

## 2017-04-05 DIAGNOSIS — N8189 Other female genital prolapse: Secondary | ICD-10-CM | POA: Insufficient documentation

## 2017-04-05 DIAGNOSIS — Z23 Encounter for immunization: Secondary | ICD-10-CM | POA: Insufficient documentation

## 2017-04-05 DIAGNOSIS — N946 Dysmenorrhea, unspecified: Secondary | ICD-10-CM | POA: Diagnosis present

## 2017-04-05 DIAGNOSIS — N920 Excessive and frequent menstruation with regular cycle: Secondary | ICD-10-CM | POA: Insufficient documentation

## 2017-04-05 DIAGNOSIS — N7011 Chronic salpingitis: Secondary | ICD-10-CM | POA: Insufficient documentation

## 2017-04-05 DIAGNOSIS — N879 Dysplasia of cervix uteri, unspecified: Secondary | ICD-10-CM | POA: Diagnosis not present

## 2017-04-05 DIAGNOSIS — Z90710 Acquired absence of both cervix and uterus: Secondary | ICD-10-CM | POA: Diagnosis present

## 2017-04-05 HISTORY — PX: RECTOCELE REPAIR: SHX761

## 2017-04-05 HISTORY — PX: LAPAROSCOPIC ASSISTED VAGINAL HYSTERECTOMY: SHX5398

## 2017-04-05 SURGERY — HYSTERECTOMY, VAGINAL, LAPAROSCOPY-ASSISTED
Anesthesia: General

## 2017-04-05 MED ORDER — KETOROLAC TROMETHAMINE 30 MG/ML IJ SOLN: 30.0000 mg | Freq: Four times a day (QID) | INTRAMUSCULAR | Status: DC

## 2017-04-05 MED ORDER — NALOXONE HCL 0.4 MG/ML IJ SOLN: 0.4000 mg | INTRAMUSCULAR | Status: DC | PRN

## 2017-04-05 MED ORDER — EPHEDRINE SULFATE 50 MG/ML IJ SOLN
INTRAMUSCULAR | Status: DC | PRN
  Administered 2017-04-05: 5 mg via INTRAVENOUS

## 2017-04-05 MED ORDER — DEXAMETHASONE SODIUM PHOSPHATE 4 MG/ML IJ SOLN
4.0000 mg | Freq: Once | INTRAMUSCULAR | Status: AC
  Administered 2017-04-05: 4 mg via INTRAVENOUS

## 2017-04-05 MED ORDER — LACTATED RINGERS IV SOLN
INTRAVENOUS | Status: DC
  Administered 2017-04-05 (×3): via INTRAVENOUS

## 2017-04-05 MED ORDER — LIDOCAINE HCL (PF) 1 % IJ SOLN
INTRAMUSCULAR | Status: AC
  Filled 2017-04-05: qty 5

## 2017-04-05 MED ORDER — SODIUM CHLORIDE 0.9% FLUSH
INTRAVENOUS | Status: AC
  Filled 2017-04-05: qty 10

## 2017-04-05 MED ORDER — PAROXETINE HCL 20 MG PO TABS
20.0000 mg | ORAL_TABLET | Freq: Every day | ORAL | Status: DC
  Administered 2017-04-06: 20 mg via ORAL
  Filled 2017-04-05: qty 1

## 2017-04-05 MED ORDER — HYDROMORPHONE 1 MG/ML IV SOLN
INTRAVENOUS | Status: DC
  Administered 2017-04-05: 14:00:00 via INTRAVENOUS

## 2017-04-05 MED ORDER — HYDROMORPHONE 1 MG/ML IV SOLN
INTRAVENOUS | Status: AC
Start: 2017-04-05 — End: 2017-04-05
  Filled 2017-04-05: qty 25

## 2017-04-05 MED ORDER — PANTOPRAZOLE SODIUM 40 MG PO TBEC: 40.0000 mg | DELAYED_RELEASE_TABLET | Freq: Every day | ORAL | Status: DC

## 2017-04-05 MED ORDER — PROPOFOL 10 MG/ML IV BOLUS
INTRAVENOUS | Status: DC | PRN
  Administered 2017-04-05: 140 mg via INTRAVENOUS

## 2017-04-05 MED ORDER — FENTANYL CITRATE (PF) 100 MCG/2ML IJ SOLN
INTRAMUSCULAR | Status: DC | PRN
  Administered 2017-04-05 (×3): 50 ug via INTRAVENOUS
  Administered 2017-04-05: 100 ug via INTRAVENOUS

## 2017-04-05 MED ORDER — ROCURONIUM BROMIDE 100 MG/10ML IV SOLN
INTRAVENOUS | Status: DC | PRN
  Administered 2017-04-05: 45 mg via INTRAVENOUS
  Administered 2017-04-05: 10 mg via INTRAVENOUS
  Administered 2017-04-05 (×3): 5 mg via INTRAVENOUS

## 2017-04-05 MED ORDER — LIDOCAINE HCL 1 % IJ SOLN
INTRAMUSCULAR | Status: DC | PRN
  Administered 2017-04-05: 30 mg via INTRADERMAL

## 2017-04-05 MED ORDER — MIDAZOLAM HCL 5 MG/5ML IJ SOLN
INTRAMUSCULAR | Status: DC | PRN
  Administered 2017-04-05: 2 mg via INTRAVENOUS

## 2017-04-05 MED ORDER — SODIUM CHLORIDE 0.9% FLUSH: 9.0000 mL | INTRAVENOUS | Status: DC | PRN

## 2017-04-05 MED ORDER — DIPHENHYDRAMINE HCL 50 MG/ML IJ SOLN: 12.5000 mg | Freq: Four times a day (QID) | INTRAMUSCULAR | Status: DC | PRN

## 2017-04-05 MED ORDER — HYDROMORPHONE HCL 1 MG/ML IJ SOLN: 0.2500 mg | INTRAMUSCULAR | Status: DC | PRN

## 2017-04-05 MED ORDER — SUCCINYLCHOLINE CHLORIDE 20 MG/ML IJ SOLN
INTRAMUSCULAR | Status: DC | PRN
  Administered 2017-04-05: 150 mg via INTRAVENOUS

## 2017-04-05 MED ORDER — LOSARTAN POTASSIUM 50 MG PO TABS
50.0000 mg | ORAL_TABLET | Freq: Every day | ORAL | Status: DC
  Administered 2017-04-06: 50 mg via ORAL
  Filled 2017-04-05: qty 1

## 2017-04-05 MED ORDER — BISACODYL 10 MG RE SUPP
10.0000 mg | Freq: Once | RECTAL | Status: AC
  Administered 2017-04-05: 10 mg via RECTAL
  Filled 2017-04-05: qty 1

## 2017-04-05 MED ORDER — MIDAZOLAM HCL 2 MG/2ML IJ SOLN
1.0000 mg | INTRAMUSCULAR | Status: DC
  Administered 2017-04-05: 2 mg via INTRAVENOUS

## 2017-04-05 MED ORDER — KETOROLAC TROMETHAMINE 30 MG/ML IJ SOLN
INTRAMUSCULAR | Status: AC
  Filled 2017-04-05: qty 1

## 2017-04-05 MED ORDER — INFLUENZA VAC SPLIT QUAD 0.5 ML IM SUSY
0.5000 mL | PREFILLED_SYRINGE | INTRAMUSCULAR | Status: AC
  Administered 2017-04-06: 0.5 mL via INTRAMUSCULAR
  Filled 2017-04-05: qty 0.5

## 2017-04-05 MED ORDER — CEFAZOLIN SODIUM-DEXTROSE 2-4 GM/100ML-% IV SOLN
2.0000 g | Freq: Once | INTRAVENOUS | Status: AC
  Administered 2017-04-05: 2 g via INTRAVENOUS

## 2017-04-05 MED ORDER — ONDANSETRON HCL 4 MG/2ML IJ SOLN: 4.0000 mg | Freq: Four times a day (QID) | INTRAMUSCULAR | Status: DC | PRN

## 2017-04-05 MED ORDER — MIDAZOLAM HCL 2 MG/2ML IJ SOLN
INTRAMUSCULAR | Status: AC
  Filled 2017-04-05: qty 2

## 2017-04-05 MED ORDER — DEXAMETHASONE SODIUM PHOSPHATE 4 MG/ML IJ SOLN
INTRAMUSCULAR | Status: AC
  Filled 2017-04-05: qty 1

## 2017-04-05 MED ORDER — ONDANSETRON HCL 4 MG PO TABS: 4.0000 mg | ORAL_TABLET | Freq: Four times a day (QID) | ORAL | Status: DC | PRN

## 2017-04-05 MED ORDER — ONDANSETRON HCL 4 MG/2ML IJ SOLN
4.0000 mg | Freq: Once | INTRAMUSCULAR | Status: AC
  Administered 2017-04-05: 4 mg via INTRAVENOUS

## 2017-04-05 MED ORDER — SUCCINYLCHOLINE CHLORIDE 20 MG/ML IJ SOLN
INTRAMUSCULAR | Status: AC
  Filled 2017-04-05: qty 1

## 2017-04-05 MED ORDER — SODIUM CHLORIDE 0.9 % IR SOLN
Status: DC | PRN
  Administered 2017-04-05 (×2): 3000 mL

## 2017-04-05 MED ORDER — SUGAMMADEX SODIUM 500 MG/5ML IV SOLN
INTRAVENOUS | Status: DC | PRN
  Administered 2017-04-05: 160 mg via INTRAVENOUS

## 2017-04-05 MED ORDER — IBUPROFEN 600 MG PO TABS: 600.0000 mg | ORAL_TABLET | Freq: Four times a day (QID) | ORAL | Status: DC | PRN

## 2017-04-05 MED ORDER — SODIUM CHLORIDE 0.9 % IV SOLN
INTRAVENOUS | Status: DC
  Administered 2017-04-05 – 2017-04-06 (×3): via INTRAVENOUS

## 2017-04-05 MED ORDER — CEFAZOLIN SODIUM-DEXTROSE 2-4 GM/100ML-% IV SOLN
INTRAVENOUS | Status: AC
  Filled 2017-04-05: qty 100

## 2017-04-05 MED ORDER — KETOROLAC TROMETHAMINE 30 MG/ML IJ SOLN
30.0000 mg | Freq: Once | INTRAMUSCULAR | Status: AC
  Administered 2017-04-05: 30 mg via INTRAVENOUS

## 2017-04-05 MED ORDER — PROPOFOL 10 MG/ML IV BOLUS
INTRAVENOUS | Status: AC
  Filled 2017-04-05: qty 20

## 2017-04-05 MED ORDER — DIPHENHYDRAMINE HCL 25 MG PO CAPS: 25.0000 mg | ORAL_CAPSULE | Freq: Every day | ORAL | Status: DC | PRN

## 2017-04-05 MED ORDER — FENTANYL CITRATE (PF) 100 MCG/2ML IJ SOLN
INTRAMUSCULAR | Status: AC
  Filled 2017-04-05: qty 2

## 2017-04-05 MED ORDER — PANTOPRAZOLE SODIUM 40 MG PO TBEC
40.0000 mg | DELAYED_RELEASE_TABLET | Freq: Every day | ORAL | Status: DC
  Administered 2017-04-06: 40 mg via ORAL
  Filled 2017-04-05: qty 1

## 2017-04-05 MED ORDER — ROCURONIUM BROMIDE 50 MG/5ML IV SOLN
INTRAVENOUS | Status: AC
  Filled 2017-04-05: qty 1

## 2017-04-05 MED ORDER — SODIUM CHLORIDE 0.9 % IR SOLN
Status: DC | PRN
  Administered 2017-04-05: 1000 mL

## 2017-04-05 MED ORDER — KETOROLAC TROMETHAMINE 30 MG/ML IJ SOLN
30.0000 mg | Freq: Four times a day (QID) | INTRAMUSCULAR | Status: DC
  Administered 2017-04-05 – 2017-04-06 (×3): 30 mg via INTRAVENOUS
  Filled 2017-04-05 (×3): qty 1

## 2017-04-05 MED ORDER — DIPHENHYDRAMINE HCL 12.5 MG/5ML PO ELIX: 12.5000 mg | ORAL_SOLUTION | Freq: Four times a day (QID) | ORAL | Status: DC | PRN

## 2017-04-05 MED ORDER — FENTANYL CITRATE (PF) 250 MCG/5ML IJ SOLN
INTRAMUSCULAR | Status: AC
  Filled 2017-04-05: qty 5

## 2017-04-05 MED ORDER — ONDANSETRON HCL 4 MG/2ML IJ SOLN
INTRAMUSCULAR | Status: AC
  Filled 2017-04-05: qty 2

## 2017-04-05 MED ORDER — BUPIVACAINE-EPINEPHRINE (PF) 0.5% -1:200000 IJ SOLN
INTRAMUSCULAR | Status: AC
  Filled 2017-04-05: qty 30

## 2017-04-05 MED ORDER — BUPIVACAINE-EPINEPHRINE 0.5% -1:200000 IJ SOLN
INTRAMUSCULAR | Status: DC | PRN
  Administered 2017-04-05: 6 mL
  Administered 2017-04-05: 24 mL

## 2017-04-05 SURGICAL SUPPLY — 70 items
APPLIER CLIP UNV 5X34 EPIX (ENDOMECHANICALS) ×3 IMPLANT
BAG HAMPER (MISCELLANEOUS) ×3 IMPLANT
BANDAGE STRIP 1X3 FLEXIBLE (GAUZE/BANDAGES/DRESSINGS) ×12 IMPLANT
BLADE SURG SZ11 CARB STEEL (BLADE) ×3 IMPLANT
CLOSURE STERI-STRIP 1/4X4 (GAUZE/BANDAGES/DRESSINGS) ×3 IMPLANT
CLOTH BEACON ORANGE TIMEOUT ST (SAFETY) ×3 IMPLANT
COVER LIGHT HANDLE STERIS (MISCELLANEOUS) ×6 IMPLANT
DECANTER SPIKE VIAL GLASS SM (MISCELLANEOUS) ×3 IMPLANT
DRAPE HALF SHEET 40X57 (DRAPES) ×3 IMPLANT
DRAPE PROXIMA HALF (DRAPES) ×3 IMPLANT
DRAPE STERI URO 9X17 APER PCH (DRAPES) ×6 IMPLANT
DURAPREP 26ML APPLICATOR (WOUND CARE) ×3 IMPLANT
ELECT REM PT RETURN 9FT ADLT (ELECTROSURGICAL) ×3
ELECTRODE REM PT RTRN 9FT ADLT (ELECTROSURGICAL) ×2 IMPLANT
FILTER SMOKE EVAC LAPAROSHD (FILTER) ×3 IMPLANT
FORMALIN 10 PREFIL 120ML (MISCELLANEOUS) ×3 IMPLANT
FORMALIN 10 PREFIL 480ML (MISCELLANEOUS) ×3 IMPLANT
GAUZE PACKING 2X5 YD STRL (GAUZE/BANDAGES/DRESSINGS) ×6 IMPLANT
GAUZE SPONGE 4X4 12PLY STRL (GAUZE/BANDAGES/DRESSINGS) ×3 IMPLANT
GAUZE SPONGE 4X4 16PLY XRAY LF (GAUZE/BANDAGES/DRESSINGS) ×3 IMPLANT
GLOVE BIOGEL M 6.5 STRL (GLOVE) ×3 IMPLANT
GLOVE BIOGEL PI IND STRL 6.5 (GLOVE) ×2 IMPLANT
GLOVE BIOGEL PI IND STRL 7.0 (GLOVE) ×8 IMPLANT
GLOVE BIOGEL PI IND STRL 9 (GLOVE) ×4 IMPLANT
GLOVE BIOGEL PI INDICATOR 6.5 (GLOVE) ×1
GLOVE BIOGEL PI INDICATOR 7.0 (GLOVE) ×4
GLOVE BIOGEL PI INDICATOR 9 (GLOVE) ×2
GLOVE ECLIPSE 6.5 STRL STRAW (GLOVE) ×12 IMPLANT
GLOVE ECLIPSE 9.0 STRL (GLOVE) ×6 IMPLANT
GOWN SPEC L3 XXLG W/TWL (GOWN DISPOSABLE) ×6 IMPLANT
GOWN STRL REUS W/TWL LRG LVL3 (GOWN DISPOSABLE) ×6 IMPLANT
INST SET LAPROSCOPIC GYN AP (KITS) ×3 IMPLANT
IV NS IRRIG 3000ML ARTHROMATIC (IV SOLUTION) ×6 IMPLANT
KIT BLADEGUARD II DBL (SET/KITS/TRAYS/PACK) ×3 IMPLANT
KIT ROOM TURNOVER AP CYSTO (KITS) ×3 IMPLANT
MANIFOLD NEPTUNE II (INSTRUMENTS) ×3 IMPLANT
NEEDLE HYPO 25X1 1.5 SAFETY (NEEDLE) ×3 IMPLANT
NEEDLE INSUFFLATION 120MM (ENDOMECHANICALS) ×3 IMPLANT
NS IRRIG 1000ML POUR BTL (IV SOLUTION) ×3 IMPLANT
PACK BASIC III (CUSTOM PROCEDURE TRAY) ×1
PACK PERI GYN (CUSTOM PROCEDURE TRAY) ×3 IMPLANT
PACK SRG BSC III STRL LF ECLPS (CUSTOM PROCEDURE TRAY) ×2 IMPLANT
PAD ARMBOARD 7.5X6 YLW CONV (MISCELLANEOUS) ×3 IMPLANT
SET BASIN LINEN APH (SET/KITS/TRAYS/PACK) ×3 IMPLANT
SET TUBE IRRIG SUCTION NO TIP (IRRIGATION / IRRIGATOR) ×3 IMPLANT
SHEARS HARMONIC ACE PLUS 36CM (ENDOMECHANICALS) ×3 IMPLANT
SLEEVE XCEL OPT CAN 5 100 (ENDOMECHANICALS) ×3 IMPLANT
SOL PREP PROV IODINE SCRUB 4OZ (MISCELLANEOUS) IMPLANT
SOLUTION ANTI FOG 6CC (MISCELLANEOUS) ×3 IMPLANT
SURGILUBE 3G PEEL PACK STRL (MISCELLANEOUS) ×3 IMPLANT
SUT CHROMIC 0 CT 1 (SUTURE) ×24 IMPLANT
SUT CHROMIC 2 0 CT 1 (SUTURE) ×3 IMPLANT
SUT PROLENE 0 CT 1 30 (SUTURE) IMPLANT
SUT PROLENE 2 0 FS (SUTURE) IMPLANT
SUT VIC AB 0 CT2 8-18 (SUTURE) ×3 IMPLANT
SUT VIC AB 2-0 CT1 27 (SUTURE)
SUT VIC AB 2-0 CT1 TAPERPNT 27 (SUTURE) IMPLANT
SUT VIC AB 4-0 PS2 27 (SUTURE) ×3 IMPLANT
SUT VICRYL 0 UR6 27IN ABS (SUTURE) ×3 IMPLANT
SYR CONTROL 10ML LL (SYRINGE) ×9 IMPLANT
SYRINGE 10CC LL (SYRINGE) ×3 IMPLANT
TOWEL OR 17X26 4PK STRL BLUE (TOWEL DISPOSABLE) ×3 IMPLANT
TRAY FOLEY CATH SILVER 16FR (SET/KITS/TRAYS/PACK) ×3 IMPLANT
TRAY FOLEY W/METER SILVER 16FR (SET/KITS/TRAYS/PACK) ×3 IMPLANT
TROCAR ENDO BLADELESS 11MM (ENDOMECHANICALS) ×3 IMPLANT
TROCAR XCEL NON-BLD 5MMX100MML (ENDOMECHANICALS) ×3 IMPLANT
TUBING INSUFFLATION (TUBING) ×3 IMPLANT
VERSALIGHT (MISCELLANEOUS) ×3 IMPLANT
WARMER LAPAROSCOPE (MISCELLANEOUS) ×3 IMPLANT
WATER STERILE IRR 1000ML POUR (IV SOLUTION) ×3 IMPLANT

## 2017-04-05 NOTE — Transfer of Care (Signed)
Immediate Anesthesia Transfer of Care Note  Patient: Jenna AldermanLisa A Jones  Procedure(s) Performed: LAPAROSCOPIC ASSISTED VAGINAL HYSTERECTOMY WITH BILATERAL SALPINGECTOMY (Bilateral ) POSTERIOR REPAIR (RECTOCELE) (N/A )  Patient Location: PACU  Anesthesia Type:General  Level of Consciousness: awake and patient cooperative  Airway & Oxygen Therapy: Patient Spontanous Breathing and Patient connected to face mask oxygen  Post-op Assessment: Report given to RN, Post -op Vital signs reviewed and stable and Patient moving all extremities  Post vital signs: Reviewed and stable  Last Vitals:  Vitals:   04/05/17 0940 04/05/17 0945  BP:  111/67  Pulse:    Resp: 13 12  Temp:    SpO2: 93% 93%    Last Pain:  Vitals:   04/05/17 0759  TempSrc: Oral  PainSc: 3       Patients Stated Pain Goal: 9 (04/05/17 0759)  Complications: No apparent anesthesia complications

## 2017-04-05 NOTE — Brief Op Note (Signed)
04/05/2017  1:06 PM  PATIENT:  Jenna AldermanLisa A Utley  42 y.o. female  PRE-OPERATIVE DIAGNOSIS:  Pelvic relaxation Rectocele  left hydrosalpinx dysmenorrhea dyspareunia  POST-OPERATIVE DIAGNOSIS:  Pelvic relaxation Rectocele dysmenorrhea dyspareunia   PROCEDURE:  Procedure(s): LAPAROSCOPIC ASSISTED VAGINAL HYSTERECTOMY WITH BILATERAL SALPINGECTOMY (Bilateral) POSTERIOR REPAIR (RECTOCELE) (N/A)  SURGEON:  Surgeon(s) and Role:    Tilda Burrow* Tionna Gigante V, MD - Primary  PHYSICIAN ASSISTANT:   ASSISTANTS: Valetta Closeebbie Dallas RNFA  ANESTHESIA:   local and general  EBL:  75 mL   BLOOD ADMINISTERED:none  DRAINS: Urinary Catheter (Foley)   LOCAL MEDICATIONS USED:  MARCAINE    and Amount: 30 ml  SPECIMEN:  Source of Specimen:  Bilateral fallopian tubes uterus and cervix  DISPOSITION OF SPECIMEN:  PATHOLOGY  COUNTS:  YES  TOURNIQUET:  * No tourniquets in log *  DICTATION: .Dragon Dictation  PLAN OF CARE: Admit for overnight observation  PATIENT DISPOSITION:  PACU - hemodynamically stable.   Delay start of Pharmacological VTE agent (>24hrs) due to surgical blood loss or risk of bleeding: not applicable Details of procedure: Patient was taken the operating room prepped and draped for combined abdominal and vaginal procedure timeout was conducted.  Antibiotics were administered.  Procedure was confirmed by surgical team.  Attention was then directed to the perineum worse Foley catheter was inserted, and cervix grasped with Hulka tenaculum and single-tooth tenaculum on the anterior lip of the cervix.  We abdomen was then addressed with infraumbilical vertical 1 cm skin incision was made as well as a small incision over the anterior superior iliac crest on each side about 3 cm, for the laparoscopic 5 mm trochars.  To achieve pneumoperitoneum under 6mmHg opening pressure after water droplet technique confirmed free intraperitoneal flow of fluid and gas the left millimeter laparoscopic trocar was inserted  under direct visualization through the umbilicus oriented toward the pelvis and photos taken to confirm that there was no evidence of bleeding or trauma associated with the entry.  There was some thin filmy adhesions from the omentum to the right upper quadrant reaching down to the level of the umbilicus these were left in place without difficulty and did not interfere with the surgical procedure.  The patient was placed in Trendelenburg position, and the uterus inspected.  The fallopian tubes there is some adhesions on the left side from the left ovary to the fallopian tube but there was not a full-blown hydrosalpinx.  Salpingectomy was performed by harmonic ACE 7 device amputating the tube off of the mesosalpinx and extracting it through 1 of the 5 mm trocar sites the right fallopian tube was extracted after similar amputation. Photos were taken documenting the adnexal structures.  The cul-de-sac was clear there is no suspicion of endometriosis.  The ovaries were normal with evidence of recent ovulation on the left ovary. The harmonic ACE 7 scalpel was then used to coagulate and free up the round ligament, the round ligament and the uterosacral ligament on the left side sharply dissecting down to just above the uterine arteries, and then lifting the bladder flap off of the anterior lower uterine segment and sharply dissecting to past the midline fraying of the bladder.  There was some relatively thick adhesions on this side attaching the bladder flap to the lower uterine segment.  Attention to the right side demonstrate utilized to perform a similar fraying of the adnexal structures at this point the bladder flap could be developed more thoroughly, about 4 mm more inferior and at this  point the bladder flap could be easily dissected off of the lower uterine segment all the way across to the other side.  Pedicles were inspected confirmed as hemostatic.  The initial laparoscopic portion of the case was considered  satisfactorily completed.  The laparoscopic trochars were retracted to the point that they barely entered into the abdominal cavity, the abdomen deflated, and instruments all were removed from the patient trocar portals.  The vaginal portion of the case follow-up Foley catheter had been in place and was positioned on the abdomen, and weighted speculum placed in the vagina and the cervix circumscribed with Marcaine with epinephrine followed by sharp dissection at the cervicovaginal junction from 8:00 clockwise around to four-point with the avascular vesicouterine space identified anteriorly and the bladder elevated off of the lower uterine segment without difficulty.  Retractor could be position and hold the bladder out of the surgical field.Marland Kitchen Posterior colpotomy incision was made entering the cul-de-sac without difficulty.  Zeppelin clamp was placed across the left uterosacral ligament, clamping cutting and suture ligating and tagging this for future orientation similarly the opposite side was treated same the lower cardinal ligaments were then clamped cut and suture-ligated on each side using 0 chromic suture as was used throughout this portion of the case. The broad ligament were then serially clamped cut and suture-ligated 1 cm bites at each side.  Pedicles were confirmed as hemostatic. The uterus was able to be extracted without difficulty and pedicles confirmed again as hemostatic Anterior peritoneum was pulled to the posterior cul-de-sac and pulled together with a running 2-0 chromic suture.  The vaginal cuff was then closed with front to back interrupted sutures of 0 chromic, being careful to attach the anterior vaginal cuff edges into the uterosacral ligament complex to improve anterior bladder support.  Hemostasis was good. Posterior repair: 2 Allis clamps were placed at the posterior fourchette at the level of the hymen remnants, approximately 2 cm from the midline on each side and then the vaginal  epithelium between the clamps infiltrated with Marcaine and the vaginal epithelium dissected off the underlying connective tissue.  This went quite easily in the avascular space.  Left index finger was placed in the rectum beneath the vaginal bed and we confirmed that the rectum was indeed intact and identified the appropriate areas for identifying tissue on each side to pull into the midline to reveal the perineal body.  After changing gloves the surgery was resumed with Allis clamps on the lateral paravaginal support tissues being pulled into the midline with a series of interrupted 0 Vicryl sutures x3 resulting in a reinforce in the rectovaginal septum for a distance of about 6cm up the posterior vaginal wall.  The vaginal epithelium was trimmed slightly with irregular edges and then reapproximated over the 0 Vicryl sutures, this time using 2-0 chromic again.  Vaginal packing was placed.  The urine output remained clear throughout the case with approximately 200s to 250 cc during the laparoscopic portion of the case and 400 cc by the end of the hysterectomy. The laparoscopic portion of the case was then resumed.  The abdomen was reinsufflated, and the pelvis inspected confirmed as hemostatic and photos taken to document status of the pelvic structures. 50 cc of saline was instilled into the abdomen and abdomen deflated laparoscopic equipment removed and the umbilical site closed at the fascial layer with interrupted 0 Vicryl, then subcuticular 4-0 Vicryl used on all 3 laparoscopic port sites Patient went to recovery room in good condition  sponge and needle counts correct.

## 2017-04-05 NOTE — Op Note (Signed)
We see the brief operative note for surgical details

## 2017-04-05 NOTE — Progress Notes (Signed)
Day of Surgery Procedure(s) (LRB): LAPAROSCOPIC ASSISTED VAGINAL HYSTERECTOMY WITH BILATERAL SALPINGECTOMY (Bilateral) POSTERIOR REPAIR (RECTOCELE) (N/A)  Subjective: Patient reports incisional pain and tolerating PO.  Drank a milkshake is desiring regular food.  Objective: I have reviewed patient's vital signs and intake and output.  General: alert, cooperative and no distress  Intake/Output Summary (Last 24 hours) at 04/05/2017 1722 Last data filed at 04/05/2017 1400 Gross per 24 hour  Intake 2260 ml  Output 800 ml  Net 1460 ml    Assessment: s/p Procedure(s): LAPAROSCOPIC ASSISTED VAGINAL HYSTERECTOMY WITH BILATERAL SALPINGECTOMY (Bilateral) POSTERIOR REPAIR (RECTOCELE) (N/A): stable  Plan: Advance diet to regular discharge in a.m. Foley out at 6 AM  LOS: 0 days    Tilda BurrowJohn V Jahmez Bily 04/05/2017, 5:22 PM
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
## 2017-04-05 NOTE — Anesthesia Procedure Notes (Signed)
Procedure Name: Intubation Date/Time: 04/05/2017 10:04 AM Performed by: Charmaine Downs, CRNA Pre-anesthesia Checklist: Patient identified, Emergency Drugs available, Suction available and Patient being monitored Patient Re-evaluated:Patient Re-evaluated prior to induction Oxygen Delivery Method: Circle system utilized Preoxygenation: Pre-oxygenation with 100% oxygen Induction Type: IV induction, Rapid sequence and Cricoid Pressure applied Ventilation: Mask ventilation without difficulty Laryngoscope Size: Mac and 3 Grade View: Grade II Tube type: Oral Tube size: 7.0 mm Number of attempts: 1 Airway Equipment and Method: Stylet Placement Confirmation: ETT inserted through vocal cords under direct vision,  positive ETCO2 and breath sounds checked- equal and bilateral Secured at: 22 cm Tube secured with: Tape Dental Injury: Teeth and Oropharynx as per pre-operative assessment  Difficulty Due To: Difficult Airway- due to limited oral opening

## 2017-04-05 NOTE — Anesthesia Postprocedure Evaluation (Signed)
Anesthesia Post Note  Patient: Jenna AldermanLisa A Raschke  Procedure(s) Performed: LAPAROSCOPIC ASSISTED VAGINAL HYSTERECTOMY WITH BILATERAL SALPINGECTOMY (Bilateral ) POSTERIOR REPAIR (RECTOCELE) (N/A )  Patient location during evaluation: PACU Anesthesia Type: General Level of consciousness: awake and patient cooperative Pain management: pain level controlled Vital Signs Assessment: post-procedure vital signs reviewed and stable Respiratory status: spontaneous breathing, nonlabored ventilation and respiratory function stable Cardiovascular status: blood pressure returned to baseline Postop Assessment: no apparent nausea or vomiting Anesthetic complications: no     Last Vitals:  Vitals:   04/05/17 1244 04/05/17 1245  BP: 122/64 122/64  Pulse: (!) 117 (!) 121  Resp: 12 12  Temp: 37.1 C   SpO2: 100% 93%    Last Pain:  Vitals:   04/05/17 1244  TempSrc:   PainSc: 6                  Lanita Stammen J

## 2017-04-05 NOTE — Anesthesia Preprocedure Evaluation (Signed)
Anesthesia Evaluation  Patient identified by MRN, date of birth, ID band Patient awake    Reviewed: Allergy & Precautions, NPO status , Patient's Chart, lab work & pertinent test results  Airway Mallampati: II  TM Distance: >3 FB Neck ROM: Full    Dental  (+) Teeth Intact   Pulmonary Current Smoker,    breath sounds clear to auscultation       Cardiovascular hypertension, Pt. on medications  Rhythm:Regular Rate:Normal     Neuro/Psych  Headaches, PSYCHIATRIC DISORDERS Anxiety Depression  Neuromuscular disease (sciatica)    GI/Hepatic GERD  Poorly Controlled,  Endo/Other    Renal/GU      Musculoskeletal   Abdominal   Peds  Hematology   Anesthesia Other Findings   Reproductive/Obstetrics                             Anesthesia Physical Anesthesia Plan  ASA: II  Anesthesia Plan: General   Post-op Pain Management:    Induction: Intravenous, Rapid sequence and Cricoid pressure planned  PONV Risk Score and Plan:   Airway Management Planned: Oral ETT  Additional Equipment:   Intra-op Plan:   Post-operative Plan: Extubation in OR  Informed Consent: I have reviewed the patients History and Physical, chart, labs and discussed the procedure including the risks, benefits and alternatives for the proposed anesthesia with the patient or authorized representative who has indicated his/her understanding and acceptance.     Plan Discussed with:   Anesthesia Plan Comments:         Anesthesia Quick Evaluation

## 2017-04-06 ENCOUNTER — Other Ambulatory Visit: Payer: Self-pay | Admitting: Obstetrics and Gynecology

## 2017-04-06 DIAGNOSIS — N920 Excessive and frequent menstruation with regular cycle: Secondary | ICD-10-CM | POA: Diagnosis not present

## 2017-04-06 DIAGNOSIS — N838 Other noninflammatory disorders of ovary, fallopian tube and broad ligament: Secondary | ICD-10-CM | POA: Diagnosis not present

## 2017-04-06 DIAGNOSIS — N8189 Other female genital prolapse: Secondary | ICD-10-CM | POA: Diagnosis not present

## 2017-04-06 DIAGNOSIS — Z23 Encounter for immunization: Secondary | ICD-10-CM | POA: Diagnosis not present

## 2017-04-06 DIAGNOSIS — N946 Dysmenorrhea, unspecified: Secondary | ICD-10-CM | POA: Diagnosis not present

## 2017-04-06 DIAGNOSIS — N816 Rectocele: Secondary | ICD-10-CM | POA: Diagnosis not present

## 2017-04-06 DIAGNOSIS — N888 Other specified noninflammatory disorders of cervix uteri: Secondary | ICD-10-CM | POA: Diagnosis not present

## 2017-04-06 DIAGNOSIS — N7011 Chronic salpingitis: Secondary | ICD-10-CM | POA: Diagnosis not present

## 2017-04-06 DIAGNOSIS — N941 Unspecified dyspareunia: Secondary | ICD-10-CM | POA: Diagnosis not present

## 2017-04-06 LAB — BASIC METABOLIC PANEL
Anion gap: 9 (ref 5–15)
BUN: 17 mg/dL (ref 6–20)
CHLORIDE: 106 mmol/L (ref 101–111)
CO2: 24 mmol/L (ref 22–32)
CREATININE: 0.93 mg/dL (ref 0.44–1.00)
Calcium: 8.4 mg/dL — ABNORMAL LOW (ref 8.9–10.3)
GFR calc Af Amer: >60 mL/min (ref >60)
GFR calc non Af Amer: >60 mL/min (ref >60)
GLUCOSE: 95 mg/dL (ref 65–99)
POTASSIUM: 4 mmol/L (ref 3.5–5.1)
SODIUM: 139 mmol/L (ref 135–145)

## 2017-04-06 LAB — CBC
HEMATOCRIT: 27.9 % — AB (ref 36.0–46.0)
HEMOGLOBIN: 8.7 g/dL — AB (ref 12.0–15.0)
MCH: 26.9 pg (ref 26.0–34.0)
MCHC: 31.2 g/dL (ref 30.0–36.0)
MCV: 86.4 fL (ref 78.0–100.0)
Platelets: 421 10*3/uL — ABNORMAL HIGH (ref 150–400)
RBC: 3.23 MIL/uL — AB (ref 3.87–5.11)
RDW: 16.7 % — ABNORMAL HIGH (ref 11.5–15.5)
WBC: 15.4 10*3/uL — ABNORMAL HIGH (ref 4.0–10.5)

## 2017-04-06 MED ORDER — OXYCODONE-ACETAMINOPHEN 5-325 MG PO TABS: 1.0000 | ORAL_TABLET | ORAL | 0 refills | Status: DC | PRN

## 2017-04-06 MED ORDER — IBUPROFEN 600 MG PO TABS: 600.0000 mg | ORAL_TABLET | Freq: Four times a day (QID) | ORAL | 0 refills | Status: DC | PRN

## 2017-04-06 NOTE — Progress Notes (Signed)
Discharge instructions read to patient and family member.  Both verbalized understanding of instructions. Discharged to home with family

## 2017-04-06 NOTE — Discharge Summary (Signed)
Physician Discharge Summary  Patient ID: Jenna AldermanLisa A Dewey MRN: [id number] DOB/AGE: 44/07/1975 44 y.o.  Admit date: 04/05/2017 Discharge date: 04/06/2017  Admission Diagnoses:  Discharge Diagnoses:  Active Problems:   Dyspareunia, female   Dysmenorrhea   Rectocele   Pelvic pain in female   Status post laparoscopic assisted vaginal hysterectomy   Discharged Condition: good  Hospital Course: Ms. Daphine DeutscherMartin was admitted for laparoscopic-assisted vaginal hysterectomy bilateral salpingectomy and posterior repair performed through same days surgery admission.  She had minimal blood loss at the time of surgery.  Surgery was considered uncomplicated she had overnight admission for pain control.  Hemoglobin dropped slightly more than expected considered to be due to hemodilution electrolytes remained stable.  She had pain rated as a 3 out of 10 on the following day had had a bowel movement and voided was ambulatory and was discharged home on POD 1  Consults: None  Significant Diagnostic Studies: labs:  CBC Latest Ref Rng & Units 04/06/2017 03/31/2017 01/20/2017  WBC 4.0 - 10.5 K/uL 15.4(H) 8.8 7.8  Hemoglobin 12.0 - 15.0 g/dL 2.1(H8.7(L) 11.1(L) 11.6  Hematocrit 36.0 - 46.0 % 27.9(L) 35.0(L) 35.5  Platelets 150 - 400 K/uL 421(H) 592(H) 504(H)   BMP Latest Ref Rng & Units 04/06/2017 03/31/2017 01/20/2017  Glucose 65 - 99 mg/dL 95 086(V127(H) 87  BUN 6 - 20 mg/dL 17 18 15   Creatinine 0.44 - 1.00 mg/dL 7.840.93 6.960.92 2.950.97  BUN/Creat Ratio 9 - 23 - - 15  Sodium 135 - 145 mmol/L 139 134(L) 142  Potassium 3.5 - 5.1 mmol/L 4.0 3.5 4.1  Chloride 101 - 111 mmol/L 106 102 105  CO2 22 - 32 mmol/L 24 18(L) 18(L)  Calcium 8.9 - 10.3 mg/dL 2.8(U8.4(L) 9.3 9.7     Treatments: surgery: Laparoscopically assisted vaginal hysterectomy bilateral salpingectomy, posterior repair  Discharge Exam: Blood pressure (!) 103/55, pulse (!) 105, temperature 98.2 F (36.8 C), temperature source Oral, resp. rate (!) 21, last menstrual period  03/18/2017, SpO2 100 %. General appearance: alert, cooperative and no distress GI: soft, non-tender; bowel sounds normal; no masses,  no organomegaly Extremities: extremities normal, atraumatic, no cyanosis or edema and Homans sign is negative, no sign of DVT  Disposition: 01-Home or Self Care  Discharge Instructions    Call MD for:  severe uncontrolled pain   Complete by:  As directed    Call MD for:  temperature >100.4   Complete by:  As directed    Diet - low sodium heart healthy   Complete by:  As directed    Increase activity slowly   Complete by:  As directed       Follow-up Information    Tilda BurrowFerguson, [place] Sigmund V, MD Follow up in 2 week(s).   Specialties:  Obstetrics and Gynecology, Radiology Contact information: [address] [place] [address] [phone]           Signed: Tilda BurrowJohn V Alissa Pharr 04/06/2017, 8:37 AM

## 2017-04-06 NOTE — Discharge Instructions (Signed)
Anterior and Posterior Colporrhaphy, Care After Refer to this sheet in the next few weeks. These instructions provide you with information on caring for yourself after your procedure. Your health care provider may also give you more specific instructions. Your treatment has been planned according to current medical practices, but problems sometimes occur. Call your health care provider if you have any problems or questions after your procedure. Follow these instructions at home:  Take frequent rest periods throughout the day.  Only take over-the-counter or prescription medicines as directed by your health care provider.  Avoid strenuous activity such as heavy lifting (more than 10 pounds [4.5 kg]), pushing, and pulling until your health care provider says it is okay.  Take showers if your health care provider approves. Pat incisions dry. Do not rub incisions with a washcloth or towel. Do not take tub baths until your health care provider approves.  Wear compression stockings as directed by your health care provider. These stockings help prevent blood clots from forming in your legs.  Talk with your health care provider about when you may return to work and your exercise routine.  Do not drive until your health care provider approves.  You may resume your normal diet. Eat a well-balanced diet.  Drink enough fluids to keep your urine clear or pale yellow.  Your normal bowel function should return. If you become constipated, you may: ? Take a mild laxative. ? Add fruit and bran to your diet. ? Drink more fluids.  Do not have sexual intercourse until permitted by your health care provider.  Follow up with your health care provider as directed. Contact a health care provider if: You have persistent nausea or vomiting. Get help right away if:  You have increased bleeding (more than a small spot) from the vaginal area.  Your pain is not relieved with medicine or becomes worse.  You  have redness, swelling, or increasing pain in the vaginal area.  You have abdominal pain.  You see pus coming from the wounds.  You develop a fever.  You have a foul smell coming from your vaginal area.  You develop light-headedness or you feel faint.  You have difficulty breathing. This information is not intended to replace advice given to you by your health care provider. Make sure you discuss any questions you have with your health care provider. Document Released: 08/27/2004 Document Revised: 07/17/2015 Document Reviewed: 06/30/2012 Elsevier Interactive Patient Education  2017 [employer]  Laparoscopically Assisted Vaginal Hysterectomy, Care After Refer to this sheet in the next few weeks. These instructions provide you with information on caring for yourself after your procedure. Your health care provider may also give you more specific instructions. Your treatment has been planned according to current medical practices, but problems sometimes occur. Call your health care provider if you have any problems or questions after your procedure. What can I expect after the procedure? After your procedure, it is typical to have the following:  Abdominal pain. You will be given pain medicine to control it.  Sore throat from the breathing tube that was inserted during surgery.  Follow these instructions at home:  Only take over-the-counter or prescription medicines for pain, discomfort, or fever as directed by your health care provider.  Do not take aspirin. It can cause bleeding.  Do not drive when taking pain medicine.  Follow your health care provider's advice regarding diet, exercise, lifting, driving, and general activities.  Resume your usual diet as directed and allowed.  Get  plenty of rest and sleep.  Do not douche, use tampons, or have sexual intercourse for at least 6 weeks, or until your health care provider gives you permission.  Change your bandages (dressings)  as directed by your health care provider.  Monitor your temperature and notify your health care provider of a fever.  Take showers instead of baths for 2-3 weeks.  Do not drink alcohol until your health care provider gives you permission.  If you develop constipation, you may take a mild laxative with your health care provider's permission. Bran foods may help with constipation problems. Drinking enough fluids to keep your urine clear or pale yellow may help as well.  Try to have someone home with you for 1-2 weeks to help around the house.  Keep all of your follow-up appointments as directed by your health care provider. Contact a health care provider if:  You have swelling, redness, or increasing pain around your incision sites.  You have pus coming from your incision.  You notice a bad smell coming from your incision.  Your incision breaks open.  You feel dizzy or lightheaded.  You have pain or bleeding when you urinate.  You have persistent diarrhea.  You have persistent nausea and vomiting.  You have abnormal vaginal discharge.  You have a rash.  You have any type of abnormal reaction or develop an allergy to your medicine.  You have poor pain control with your prescribed medicine. Get help right away if:  You have a fever.  You have severe abdominal pain.  You have chest pain.  You have shortness of breath.  You faint.  You have pain, swelling, or redness in your leg.  You have heavy vaginal bleeding with blood clots. This information is not intended to replace advice given to you by your health care provider. Make sure you discuss any questions you have with your health care provider. Document Released: 01/28/2011 Document Revised: 07/17/2015 Document Reviewed: 08/24/2012 Elsevier Interactive Patient Education  2017 [employer].

## 2017-04-07 ENCOUNTER — Encounter (HOSPITAL_COMMUNITY): Payer: Self-pay | Admitting: Obstetrics and Gynecology

## 2017-04-10 ENCOUNTER — Encounter (HOSPITAL_COMMUNITY): Payer: Self-pay | Admitting: Emergency Medicine

## 2017-04-10 ENCOUNTER — Emergency Department (HOSPITAL_COMMUNITY)
Admission: EM | Admit: 2017-04-10 | Discharge: 2017-04-10 | Disposition: A | Discharge status: Home / Self Care | Payer: Federal, State, Local not specified - PPO | Attending: Emergency Medicine | Admitting: Emergency Medicine

## 2017-04-10 ENCOUNTER — Other Ambulatory Visit: Payer: Self-pay

## 2017-04-10 DIAGNOSIS — N938 Other specified abnormal uterine and vaginal bleeding: Secondary | ICD-10-CM | POA: Diagnosis not present

## 2017-04-10 DIAGNOSIS — Z79899 Other long term (current) drug therapy: Secondary | ICD-10-CM | POA: Insufficient documentation

## 2017-04-10 DIAGNOSIS — N939 Abnormal uterine and vaginal bleeding, unspecified: Secondary | ICD-10-CM

## 2017-04-10 DIAGNOSIS — F17210 Nicotine dependence, cigarettes, uncomplicated: Secondary | ICD-10-CM | POA: Diagnosis not present

## 2017-04-10 DIAGNOSIS — I10 Essential (primary) hypertension: Secondary | ICD-10-CM | POA: Insufficient documentation

## 2017-04-10 MED ORDER — POLYETHYLENE GLYCOL 3350 17 G PO PACK: 17.0000 g | PACK | Freq: Two times a day (BID) | ORAL | 0 refills | Status: DC | PRN

## 2017-04-10 MED ORDER — FLEET ENEMA 7-19 GM/118ML RE ENEM: 4.0000 | ENEMA | Freq: Every day | RECTAL | 0 refills | Status: DC | PRN

## 2017-04-10 NOTE — ED Triage Notes (Signed)
Pt reports having a hysterectomy by Dr. Emelda FearFerguson on 2/12. States on Friday, she had her first bowel movement with excruciating pain and there was a lot of blood from her vagina. States today after urinating she began having more pain and began to constantly drip blood. Husband states it looks like her stitches have ripped on her perineum. Pt endorses dizziness, HA, lightheadedness and RT pelvic pain.
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
## 2017-04-13 ENCOUNTER — Ambulatory Visit (INDEPENDENT_AMBULATORY_CARE_PROVIDER_SITE_OTHER): Payer: Federal, State, Local not specified - PPO | Admitting: Obstetrics and Gynecology

## 2017-04-13 ENCOUNTER — Encounter: Payer: Self-pay | Admitting: Obstetrics and Gynecology

## 2017-04-13 ENCOUNTER — Other Ambulatory Visit: Payer: Self-pay | Admitting: Physician Assistant

## 2017-04-13 VITALS — BP 140/90 | HR 98 | Wt 166.8 lb

## 2017-04-13 DIAGNOSIS — Z9889 Other specified postprocedural states: Secondary | ICD-10-CM

## 2017-04-13 DIAGNOSIS — I10 Essential (primary) hypertension: Secondary | ICD-10-CM

## 2017-04-13 DIAGNOSIS — N898 Other specified noninflammatory disorders of vagina: Secondary | ICD-10-CM | POA: Insufficient documentation

## 2017-04-13 DIAGNOSIS — G43109 Migraine with aura, not intractable, without status migrainosus: Secondary | ICD-10-CM

## 2017-04-13 MED ORDER — OXYCODONE-ACETAMINOPHEN 5-325 MG PO TABS: 1.0000 | ORAL_TABLET | ORAL | 0 refills | Status: DC | PRN

## 2017-04-13 MED ORDER — METRONIDAZOLE 500 MG PO TABS: 500.0000 mg | ORAL_TABLET | Freq: Two times a day (BID) | ORAL | 0 refills | Status: AC

## 2017-04-13 NOTE — Progress Notes (Signed)
Patient ID: Jenna AldermanLisa A Jones, female   DOB: 02/02/1976, 42 y.o.   MRN: [id number]    Subjective:  Jenna AldermanLisa A Jones is a 42 y.o. female now 1 weeks status post LAPAROSCOPIC ASSISTED VAGINAL HYSTERECTOMY WITH BILATERAL SALPINGECTOMY and POSTERIOR REPAIR (RECTOCELE).   She was seen in ED on Sunday 04/10/2017 for a bleeding and it was discovered she had a posterior repair hematoma. They were not sure what it was exactly and the doctors were concerned it was a prolapse.  The husband had noted a greenish bulge in the posterior vaginal wall. Review of Systems Negative except bleeding   Diet:   normal   Bowel movements : She has been trying to avoid having a BM because she expereinces a "pop", bleeding and pain..  She has been taking her prescribed pain medication.  Objective:  BP 140/90 (BP Location: Right Arm, Patient Position: Sitting, Cuff Size: Small)   Pulse 98   Wt 166 lb 12.8 oz (75.7 kg)   LMP 03/18/2017   BMI 31.52 kg/m  General:Well developed, well nourished.  No acute distress. Abdomen: Bowel sounds normal, soft, non-tender. Pelvic Exam:    External Genitalia:  Normal, intact     Vagina: Normal, the vagina shows a 2 cm x 2 cm bulge in the posterior vagina at the upper aspects of the posterior repair which is ecchymotic and nonpurulent, representing epithelium over a small hematoma    Cervix: Surgically Absent    Uterus: Surgically Absent    Adnexa/Bimanual: Normal    Poster Repair: small 2 cm buldging  Incision(s):   Healing complicated by posterior repair hematoma..  The patient agrees to evacuation of the hematoma.  Patient is switched to room 2 where Kelly clamp was used to probe the small 1 cm defect at the apex of the posterior repair.  Were able to expel multiple small old dark clot fragments and massage the area such that no more clot tissue could be removed.  The area is no longer swollen but there is some induration beneath it.  Digital rectal exam shows intact rectum with good   support from the posterior repair   Assessment:  Post-Op 1 weeks s/p LAPAROSCOPIC ASSISTED VAGINAL HYSTERECTOMY WITH BILATERAL SALPINGECTOMY and POSTERIOR REPAIR (RECTOCELE)   Postoperative hematoma of the posterior repair, evacuated today.   Plan:  1.Wound care discussed   2. Rx Metronidazole, refill RX percocet 3. Activity restrictions: Continue postop care restrictions 4. return to work: not applicable. 5. Follow up in 1 week.  By signing my name below, I, Diona BrownerJennifer Gorman, attest that this documentation has been prepared under the direction and in the presence of Tilda BurrowFerguson, Jenna Jones, Jenna Jones. Electronically Signed: Diona BrownerJennifer Gorman, Medical Scribe. 04/13/17. 10:02 AM.  I personally performed the services described in this documentation, which was SCRIBED in my presence. The recorded information has been reviewed and considered accurate. It has been edited as necessary during review. Tilda BurrowJohn Jones Bryanda Mikel, Jenna Jones

## 2017-04-20 ENCOUNTER — Encounter: Payer: Federal, State, Local not specified - PPO | Admitting: Obstetrics and Gynecology

## 2017-04-22 ENCOUNTER — Encounter: Payer: Self-pay | Admitting: Obstetrics and Gynecology

## 2017-04-22 ENCOUNTER — Ambulatory Visit (INDEPENDENT_AMBULATORY_CARE_PROVIDER_SITE_OTHER): Payer: Federal, State, Local not specified - PPO | Admitting: Obstetrics and Gynecology

## 2017-04-22 VITALS — BP 120/80 | HR 97 | Ht 61.0 in | Wt 167.2 lb

## 2017-04-22 DIAGNOSIS — Z9889 Other specified postprocedural states: Secondary | ICD-10-CM

## 2017-04-22 DIAGNOSIS — Z4889 Encounter for other specified surgical aftercare: Secondary | ICD-10-CM | POA: Insufficient documentation

## 2017-04-22 MED ORDER — ESTRADIOL 1 MG PO TABS: 1.0000 mg | ORAL_TABLET | Freq: Every day | ORAL | 11 refills | Status: DC

## 2017-04-22 NOTE — Progress Notes (Signed)
   Subjective:  Jenna Jones is a 42 y.o. female now 2 weeks status post laparoscopic assisted vaginal hysterectomy with posterior repair.  This was complicated by a vaginal hematoma in the posterior repair  The patient is noted some spasms in the rectal area.  She says having bowel movements and has become easier the bowel movements are no longer round, and appropriately soft and shaped normal she is on MiraLAX.  She denies fever chills nausea vomiting. Review of Systems Negative except rectal spasm   Diet:   Regular   Bowel movements : normal.  Pain is controlled with current analgesics. Medications being used: ibuprofen (OTC).  Objective:  BP 120/80 (BP Location: Right Arm, Patient Position: Sitting, Cuff Size: Small)   Pulse 97   Ht 5\' 1"  (1.549 m)   Wt 167 lb 3.2 oz (75.8 kg)   LMP 03/18/2017   BMI 31.59 kg/m  General:Well developed, well nourished.  No acute distress. Abdomen: Bowel sounds normal, soft, non-tender. Pelvic Exam:    External Genitalia:  Normal.    Vagina: There is still some ecchymosis in the area of the posterior repair but its now sealed over, and except for slight bruising appears to be healing acceptably well vaginal exam permits passage of 2 fingers    Cervix: Surgically absent    Uterus: Surgically absent    Adnexa/Bimanual: Normal postoperative changes in the vaginal cuff.  Incision(s):   Healing better, no drainage, no erythema, no hernia, no swelling, no dehiscence,      Assessment:  Post-Op 2 weeks s/p laparoscopic assisted vaginal hysterectomy and posterior colporrhaphy complicated by posterior repair hematoma that has now drained and is recovering   I expect recovery to resume normal postoperative process.   Plan:  1.Wound care discussed lifting limitations discussed 2. . current medications.  MiraLAX, ibuprofen 3. Activity restrictions: May drive, weightlifting to be delayed 4. return to work: not applicable. 5. Follow up in 3  weeks.

## 2017-05-11 ENCOUNTER — Encounter: Payer: Self-pay | Admitting: Obstetrics and Gynecology

## 2017-05-11 ENCOUNTER — Ambulatory Visit (INDEPENDENT_AMBULATORY_CARE_PROVIDER_SITE_OTHER): Payer: Federal, State, Local not specified - PPO | Admitting: Obstetrics and Gynecology

## 2017-05-11 ENCOUNTER — Other Ambulatory Visit: Payer: Self-pay

## 2017-05-11 VITALS — BP 118/70 | HR 109 | Ht 61.0 in | Wt 166.8 lb

## 2017-05-11 DIAGNOSIS — Z9889 Other specified postprocedural states: Secondary | ICD-10-CM

## 2017-05-11 DIAGNOSIS — Z09 Encounter for follow-up examination after completed treatment for conditions other than malignant neoplasm: Secondary | ICD-10-CM

## 2017-05-11 NOTE — Progress Notes (Signed)
Patient ID: Jenna AldermanLisa A Jones, female   DOB: 02/20/1976, 42 y.o.   MRN: [id number]    Subjective:  Jenna AldermanLisa A Jones is a 42 y.o. female now 5 weeks status post laparoscopic assisted vaginal hysterectomy with posterior repair. This was complicated by a vaginal hematoma in the posterior repair.  She comes in smiling enthusiastic and seems to be doing quite well she denies fever chills Bowel movements are described as notably better  She is doing well, overall.  Review of Systems Negative   Diet:   normal   Bowel movements : She reports that it is not as hard to defecate and that her stool is softer.  The patient is not having any pain.  Objective:  BP 118/70 (BP Location: Right Arm, Patient Position: Sitting, Cuff Size: Normal)   Pulse (!) 109   Ht 5\' 1"  (1.549 m)   Wt 166 lb 12.8 oz (75.7 kg)   LMP 03/18/2017   BMI 31.52 kg/m  General:Well developed, well nourished.  No acute distress. Abdomen: Bowel sounds normal, soft, non-tender. Pelvic Exam:    External Genitalia:  Normal.    Vagina: Normal, posterior wall hematoma resolved    Cervix: Normal    Uterus: Normal    Adnexa/Bimanual: Normal, good vaginal apex support, right sided adnexa mild tenderness at cuff, normal healing vs ovary adhesions. Left adnexa negative  Posterior wall support on digital rectal exam shows upper aspects of the posterior repair is quite good.  Of note the patient had sensation of a popping sensation during the postop course she may have popped a stitch in the lower portion of the repair Incision(s):   Healing well, no drainage, no erythema, no hernia, no swelling, no dehiscence,     Assessment:  Post-Op 5 weeks s/p laparoscopic assisted vaginal hysterectomy with posterior repair. This was complicated by a vaginal hematoma in the posterior repair.  Doing well postoperatively.   Plan:  1.Wound care discussed   2. . current medications. 3. Activity restrictions: none 4. return to work: now. 5. Follow up in 1  year.  By signing my name below, I, Jenna Jones, attest that this documentation has been prepared under the direction and in the presence of Jenna Jones, Jenna Jones V, MD. Electronically Signed: Diona BrownerJennifer Jones, Medical Scribe. 05/11/17. 9:14 AM. .jjfs I personally performed the services described in this documentation, which was SCRIBED in my presence. The recorded information has been reviewed and considered accurate. It has been edited as necessary during review. Jenna BurrowJohn Jones Filmore Molyneux, MD

## 2017-06-11 ENCOUNTER — Other Ambulatory Visit: Payer: Self-pay | Admitting: Obstetrics and Gynecology

## 2017-06-12 ENCOUNTER — Other Ambulatory Visit: Payer: Self-pay | Admitting: Physician Assistant

## 2017-06-12 DIAGNOSIS — G43109 Migraine with aura, not intractable, without status migrainosus: Secondary | ICD-10-CM

## 2017-07-09 ENCOUNTER — Other Ambulatory Visit: Payer: Self-pay | Admitting: Physician Assistant

## 2017-07-09 DIAGNOSIS — I10 Essential (primary) hypertension: Secondary | ICD-10-CM

## 2017-07-27 ENCOUNTER — Other Ambulatory Visit: Payer: Self-pay | Admitting: Physician Assistant

## 2017-07-27 ENCOUNTER — Other Ambulatory Visit: Payer: Self-pay | Admitting: *Deleted

## 2017-07-27 DIAGNOSIS — I10 Essential (primary) hypertension: Secondary | ICD-10-CM

## 2017-07-28 MED ORDER — TRAMADOL HCL 50 MG PO TABS: ORAL_TABLET | ORAL | 0 refills | Status: DC

## 2017-08-10 ENCOUNTER — Ambulatory Visit: Payer: Federal, State, Local not specified - PPO | Admitting: Physician Assistant

## 2017-08-11 ENCOUNTER — Encounter: Payer: Self-pay | Admitting: Physician Assistant

## 2017-09-15 ENCOUNTER — Other Ambulatory Visit: Payer: Self-pay | Admitting: Obstetrics and Gynecology

## 2017-10-28 ENCOUNTER — Other Ambulatory Visit: Payer: Self-pay | Admitting: Physician Assistant

## 2017-10-28 DIAGNOSIS — G43109 Migraine with aura, not intractable, without status migrainosus: Secondary | ICD-10-CM

## 2017-12-21 ENCOUNTER — Emergency Department (HOSPITAL_COMMUNITY): Payer: Federal, State, Local not specified - PPO

## 2017-12-21 ENCOUNTER — Other Ambulatory Visit: Payer: Self-pay

## 2017-12-21 ENCOUNTER — Encounter (HOSPITAL_COMMUNITY): Payer: Self-pay | Admitting: Emergency Medicine

## 2017-12-21 ENCOUNTER — Emergency Department (HOSPITAL_COMMUNITY)
Admission: EM | Admit: 2017-12-21 | Discharge: 2017-12-22 | Disposition: A | Discharge status: Home / Self Care | Payer: Federal, State, Local not specified - PPO | Attending: Emergency Medicine | Admitting: Emergency Medicine

## 2017-12-21 DIAGNOSIS — Z23 Encounter for immunization: Secondary | ICD-10-CM | POA: Diagnosis not present

## 2017-12-21 DIAGNOSIS — S92025A Nondisplaced fracture of anterior process of left calcaneus, initial encounter for closed fracture: Secondary | ICD-10-CM

## 2017-12-21 DIAGNOSIS — S92022A Displaced fracture of anterior process of left calcaneus, initial encounter for closed fracture: Secondary | ICD-10-CM | POA: Diagnosis not present

## 2017-12-21 DIAGNOSIS — I10 Essential (primary) hypertension: Secondary | ICD-10-CM | POA: Diagnosis not present

## 2017-12-21 DIAGNOSIS — Z79899 Other long term (current) drug therapy: Secondary | ICD-10-CM | POA: Insufficient documentation

## 2017-12-21 DIAGNOSIS — Y9389 Activity, other specified: Secondary | ICD-10-CM | POA: Diagnosis not present

## 2017-12-21 DIAGNOSIS — F17210 Nicotine dependence, cigarettes, uncomplicated: Secondary | ICD-10-CM | POA: Insufficient documentation

## 2017-12-21 DIAGNOSIS — Y929 Unspecified place or not applicable: Secondary | ICD-10-CM | POA: Diagnosis not present

## 2017-12-21 DIAGNOSIS — F329 Major depressive disorder, single episode, unspecified: Secondary | ICD-10-CM | POA: Diagnosis not present

## 2017-12-21 DIAGNOSIS — W108XXA Fall (on) (from) other stairs and steps, initial encounter: Secondary | ICD-10-CM | POA: Diagnosis not present

## 2017-12-21 DIAGNOSIS — Y998 Other external cause status: Secondary | ICD-10-CM | POA: Diagnosis not present

## 2017-12-21 DIAGNOSIS — F419 Anxiety disorder, unspecified: Secondary | ICD-10-CM | POA: Diagnosis not present

## 2017-12-21 DIAGNOSIS — S99922A Unspecified injury of left foot, initial encounter: Secondary | ICD-10-CM | POA: Diagnosis not present

## 2017-12-21 MED ORDER — NAPROXEN 500 MG PO TABS: ORAL_TABLET | ORAL | 0 refills | Status: DC

## 2017-12-21 MED ORDER — OXYCODONE-ACETAMINOPHEN 5-325 MG PO TABS: 1.0000 | ORAL_TABLET | Freq: Four times a day (QID) | ORAL | 0 refills | Status: DC | PRN

## 2017-12-21 MED ORDER — OXYCODONE-ACETAMINOPHEN 5-325 MG PO TABS
1.0000 | ORAL_TABLET | Freq: Once | ORAL | Status: AC
  Administered 2017-12-21: 1 via ORAL
  Filled 2017-12-21: qty 1

## 2017-12-21 MED ORDER — NAPROXEN 250 MG PO TABS
500.0000 mg | ORAL_TABLET | Freq: Once | ORAL | Status: AC
  Administered 2017-12-21: 500 mg via ORAL
  Filled 2017-12-21: qty 2

## 2017-12-21 NOTE — Discharge Instructions (Addendum)
Please elevate your foot and use ice packs to help get the swelling down.  Wear the splint and do not put any weight on it, you will need to use your crutches.  Take the medications as prescribed for pain.  Please call Dr. Mort Sawyers office, the orthopedist on-call, to get a follow-up appointment within the week to assess if your bruising and swelling has improved enough to get a cast.  What you have is a "nondisplaced fracture of the anterolateral process of the calcaneus".
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
## 2017-12-21 NOTE — ED Triage Notes (Signed)
Pt c/o left foot swelling, bruising and pain after slipping off bottom step, twisting over x 30 mins ago

## 2017-12-22 MED ORDER — TETANUS-DIPHTH-ACELL PERTUSSIS 5-2.5-18.5 LF-MCG/0.5 IM SUSP
0.5000 mL | Freq: Once | INTRAMUSCULAR | Status: AC
  Administered 2017-12-22: 0.5 mL via INTRAMUSCULAR
  Filled 2017-12-22: qty 0.5

## 2017-12-26 ENCOUNTER — Ambulatory Visit (INDEPENDENT_AMBULATORY_CARE_PROVIDER_SITE_OTHER): Payer: Federal, State, Local not specified - PPO | Admitting: Orthopedic Surgery

## 2017-12-26 ENCOUNTER — Encounter: Payer: Self-pay | Admitting: Orthopedic Surgery

## 2017-12-26 VITALS — BP 161/104 | HR 122 | Ht 61.0 in

## 2017-12-26 DIAGNOSIS — F17210 Nicotine dependence, cigarettes, uncomplicated: Secondary | ICD-10-CM | POA: Diagnosis not present

## 2017-12-26 DIAGNOSIS — S92025A Nondisplaced fracture of anterior process of left calcaneus, initial encounter for closed fracture: Secondary | ICD-10-CM | POA: Diagnosis not present

## 2017-12-26 MED ORDER — TRAMADOL HCL 50 MG PO TABS: 50.0000 mg | ORAL_TABLET | Freq: Four times a day (QID) | ORAL | 0 refills | Status: AC | PRN

## 2017-12-26 NOTE — Patient Instructions (Signed)
Weightbearing as tolerated with crutches for up to 1 week  Wear brace when walking  Tramadol for pain  Add Tylenol as needed  Wear brace 5 weeks
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
## 2018-01-26 DIAGNOSIS — S92025A Nondisplaced fracture of anterior process of left calcaneus, initial encounter for closed fracture: Secondary | ICD-10-CM | POA: Insufficient documentation

## 2018-01-30 ENCOUNTER — Ambulatory Visit: Payer: Federal, State, Local not specified - PPO | Admitting: Orthopedic Surgery

## 2018-02-03 ENCOUNTER — Ambulatory Visit: Payer: Federal, State, Local not specified - PPO | Admitting: Orthopedic Surgery

## 2018-02-06 ENCOUNTER — Ambulatory Visit (INDEPENDENT_AMBULATORY_CARE_PROVIDER_SITE_OTHER): Payer: Federal, State, Local not specified - PPO | Admitting: Orthopedic Surgery

## 2018-02-06 VITALS — BP 151/113 | HR 115 | Ht 61.0 in | Wt 166.0 lb

## 2018-02-06 DIAGNOSIS — S92025D Nondisplaced fracture of anterior process of left calcaneus, subsequent encounter for fracture with routine healing: Secondary | ICD-10-CM | POA: Diagnosis not present

## 2018-02-06 NOTE — Progress Notes (Signed)
Progress Note   Patient ID: Jenna Jones, female   DOB: 08/24/1975, 42 y.o.   MRN: [id number]   Chief Complaint  Patient presents with  . Follow-up    Recheck on left ankle, DOI 12-21-17.    HPI 42 years old had a anterior process calcaneus fracture October 30 from a simple fall she was treated with tramadol and Naprosyn not naproxen and comes back in complaining of pain in her arch and that her big toe is popping  ROS Also having pain in the lesser toes as well  Allergies  Allergen Reactions  . Codeine Itching     BP (!) 151/113   Pulse (!) 115   Ht 5\' 1"  (1.549 m)   Wt 166 lb (75.3 kg)   LMP 03/18/2017   BMI 31.37 kg/m   Physical Exam Vitals signs reviewed.  Constitutional:      Appearance: She is well-developed.  Neurological:     Mental Status: She is alert and oriented to person, place, and time.     Sensory: Sensation is intact.     Motor: Motor function is intact.     Gait: Gait abnormal.  Psychiatric:        Judgment: Judgment normal.    Antalgic gait favoring the injured left side with external rotation foot in the boot  She is tender over the anterior process of the calcaneus she has pain with dorsiflexion plantarflexion of the MTP joints of the lesser digits mild pain at terminal range of motion of the great toe with a medial spur suggesting arthritis there  Ankle is stable to anterior drawer test and inversion testing   Medical decisions:   Data  Imaging:   Prior imaging shows a anterior process calcaneus fracture best seen on the lateral x-ray  Encounter Diagnosis  Name Primary?  . Closed nondisplaced fracture of anterior process of left calcaneus with routine healing, subsequent encounter Yes    PLAN:   Recommend continue twice a day Epson salt soaks switch to ankle sleeve  She can return if she is having any further trouble.    Fuller CanadaStanley Harrison, MD 02/06/2018 9:21 AM

## 2018-02-06 NOTE — Patient Instructions (Signed)
Warm soaks twice a day   Ankle sleeve

## 2018-02-13 ENCOUNTER — Encounter: Payer: Self-pay | Admitting: Family Medicine

## 2018-02-13 ENCOUNTER — Ambulatory Visit: Payer: Federal, State, Local not specified - PPO | Admitting: Family Medicine

## 2018-02-13 VITALS — BP 196/120 | HR 128 | Temp 99.0°F | Resp 16 | Ht 61.0 in | Wt 172.0 lb

## 2018-02-13 DIAGNOSIS — D508 Other iron deficiency anemias: Secondary | ICD-10-CM | POA: Diagnosis not present

## 2018-02-13 DIAGNOSIS — G43109 Migraine with aura, not intractable, without status migrainosus: Secondary | ICD-10-CM | POA: Diagnosis not present

## 2018-02-13 DIAGNOSIS — I10 Essential (primary) hypertension: Secondary | ICD-10-CM | POA: Diagnosis not present

## 2018-02-13 MED ORDER — HYDROCHLOROTHIAZIDE 25 MG PO TABS: 25.0000 mg | ORAL_TABLET | Freq: Every day | ORAL | 3 refills | Status: DC

## 2018-02-13 MED ORDER — LOSARTAN POTASSIUM 100 MG PO TABS: 100.0000 mg | ORAL_TABLET | Freq: Every day | ORAL | 3 refills | Status: DC

## 2018-02-13 MED ORDER — CYCLOBENZAPRINE HCL 10 MG PO TABS: 10.0000 mg | ORAL_TABLET | Freq: Three times a day (TID) | ORAL | 0 refills | Status: DC | PRN

## 2018-02-13 MED ORDER — AMLODIPINE BESYLATE 10 MG PO TABS: 10.0000 mg | ORAL_TABLET | Freq: Every day | ORAL | 3 refills | Status: DC
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
## 2018-02-14 LAB — CBC WITH DIFFERENTIAL/PLATELET
ABSOLUTE MONOCYTES: 540 {cells}/uL (ref 200–950)
BASOS ABS: 57 {cells}/uL (ref 0–200)
Basophils Relative: 0.8 %
EOS ABS: 50 {cells}/uL (ref 15–500)
EOS PCT: 0.7 %
HEMATOCRIT: 40.4 % (ref 35.0–45.0)
HEMOGLOBIN: 13.5 g/dL (ref 11.7–15.5)
LYMPHS ABS: 1853 {cells}/uL (ref 850–3900)
MCH: 28.2 pg (ref 27.0–33.0)
MCHC: 33.4 g/dL (ref 32.0–36.0)
MCV: 84.3 fL (ref 80.0–100.0)
MPV: 10.1 fL (ref 7.5–12.5)
Monocytes Relative: 7.6 %
Neutro Abs: 4601 cells/uL (ref 1500–7800)
Neutrophils Relative %: 64.8 %
Platelets: 621 10*3/uL — ABNORMAL HIGH (ref 140–400)
RBC: 4.79 10*6/uL (ref 3.80–5.10)
RDW: 15.8 % — ABNORMAL HIGH (ref 11.0–15.0)
Total Lymphocyte: 26.1 %
WBC: 7.1 10*3/uL (ref 3.8–10.8)

## 2018-02-14 LAB — COMPLETE METABOLIC PANEL WITH GFR
AG RATIO: 1.5 (calc) (ref 1.0–2.5)
ALKALINE PHOSPHATASE (APISO): 84 U/L (ref 33–115)
ALT: 25 U/L (ref 6–29)
AST: 31 U/L — AB (ref 10–30)
Albumin: 4.8 g/dL (ref 3.6–5.1)
BUN: 13 mg/dL (ref 7–25)
CO2: 22 mmol/L (ref 20–32)
Calcium: 10.7 mg/dL — ABNORMAL HIGH (ref 8.6–10.2)
Chloride: 97 mmol/L — ABNORMAL LOW (ref 98–110)
Creat: 1.04 mg/dL (ref 0.50–1.10)
GFR, EST NON AFRICAN AMERICAN: 65 mL/min/{1.73_m2} (ref >=60)
GFR, Est African American: 76 mL/min/{1.73_m2} (ref >=60)
GLOBULIN: 3.3 g/dL (ref 1.9–3.7)
Glucose, Bld: 105 mg/dL — ABNORMAL HIGH (ref 65–99)
Potassium: 4.5 mmol/L (ref 3.5–5.3)
SODIUM: 138 mmol/L (ref 135–146)
Total Bilirubin: 0.4 mg/dL (ref 0.2–1.2)
Total Protein: 8.1 g/dL (ref 6.1–8.1)

## 2018-02-14 LAB — LIPID PANEL
CHOLESTEROL: 304 mg/dL — AB (ref <200)
HDL: 39 mg/dL — AB (ref >50)
NON-HDL CHOLESTEROL (CALC): 265 mg/dL — AB (ref <130)
Total CHOL/HDL Ratio: 7.8 (calc) — ABNORMAL HIGH (ref <5.0)
Triglycerides: 688 mg/dL — ABNORMAL HIGH (ref <150)

## 2018-02-28 ENCOUNTER — Encounter: Payer: Self-pay | Admitting: Family Medicine

## 2018-02-28 ENCOUNTER — Ambulatory Visit: Payer: Federal, State, Local not specified - PPO | Admitting: Family Medicine

## 2018-02-28 VITALS — BP 128/80 | HR 110 | Temp 97.9°F | Resp 16 | Ht 61.0 in | Wt 174.0 lb

## 2018-02-28 DIAGNOSIS — R000 Tachycardia, unspecified: Secondary | ICD-10-CM

## 2018-02-28 DIAGNOSIS — G43109 Migraine with aura, not intractable, without status migrainosus: Secondary | ICD-10-CM | POA: Diagnosis not present

## 2018-02-28 DIAGNOSIS — I10 Essential (primary) hypertension: Secondary | ICD-10-CM

## 2018-02-28 DIAGNOSIS — E782 Mixed hyperlipidemia: Secondary | ICD-10-CM | POA: Diagnosis not present

## 2018-02-28 LAB — TSH: TSH: 0.77 mIU/L

## 2018-02-28 MED ORDER — ATORVASTATIN CALCIUM 40 MG PO TABS: 40.0000 mg | ORAL_TABLET | Freq: Every day | ORAL | 3 refills | Status: DC

## 2018-02-28 MED ORDER — OMEPRAZOLE 40 MG PO CPDR: 40.0000 mg | DELAYED_RELEASE_CAPSULE | Freq: Every day | ORAL | 11 refills | Status: DC

## 2018-02-28 MED ORDER — CYCLOBENZAPRINE HCL 10 MG PO TABS: 10.0000 mg | ORAL_TABLET | Freq: Three times a day (TID) | ORAL | 0 refills | Status: DC | PRN
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
## 2018-03-02 ENCOUNTER — Other Ambulatory Visit: Payer: Self-pay | Admitting: Family Medicine

## 2018-03-02 MED ORDER — METOPROLOL SUCCINATE ER 50 MG PO TB24: 50.0000 mg | ORAL_TABLET | Freq: Every day | ORAL | 1 refills | Status: DC

## 2018-03-31 ENCOUNTER — Ambulatory Visit: Payer: Federal, State, Local not specified - PPO | Admitting: Family Medicine

## 2018-04-03 ENCOUNTER — Ambulatory Visit: Payer: Federal, State, Local not specified - PPO | Admitting: Family Medicine

## 2018-04-14 ENCOUNTER — Ambulatory Visit: Payer: Federal, State, Local not specified - PPO | Admitting: Family Medicine

## 2018-04-17 ENCOUNTER — Ambulatory Visit: Payer: Federal, State, Local not specified - PPO | Admitting: Family Medicine

## 2018-04-18 ENCOUNTER — Encounter: Payer: Self-pay | Admitting: Family Medicine

## 2018-04-18 ENCOUNTER — Ambulatory Visit (INDEPENDENT_AMBULATORY_CARE_PROVIDER_SITE_OTHER): Payer: Federal, State, Local not specified - PPO | Admitting: Family Medicine

## 2018-04-18 VITALS — BP 136/84 | HR 120 | Temp 98.5°F | Resp 16 | Ht 61.0 in | Wt 175.0 lb

## 2018-04-18 DIAGNOSIS — G43109 Migraine with aura, not intractable, without status migrainosus: Secondary | ICD-10-CM

## 2018-04-18 DIAGNOSIS — F339 Major depressive disorder, recurrent, unspecified: Secondary | ICD-10-CM

## 2018-04-18 DIAGNOSIS — I10 Essential (primary) hypertension: Secondary | ICD-10-CM | POA: Diagnosis not present

## 2018-04-18 MED ORDER — DULOXETINE HCL 60 MG PO CPEP: 60.0000 mg | ORAL_CAPSULE | Freq: Every day | ORAL | 3 refills | Status: DC

## 2018-04-18 MED ORDER — CYCLOBENZAPRINE HCL 10 MG PO TABS: 10.0000 mg | ORAL_TABLET | Freq: Three times a day (TID) | ORAL | 0 refills | Status: DC | PRN
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
## 2018-04-26 ENCOUNTER — Encounter: Payer: Self-pay | Admitting: Family Medicine

## 2018-04-26 ENCOUNTER — Other Ambulatory Visit: Payer: Self-pay | Admitting: Obstetrics and Gynecology

## 2018-05-16 ENCOUNTER — Other Ambulatory Visit: Payer: Self-pay

## 2018-05-16 ENCOUNTER — Other Ambulatory Visit: Payer: Self-pay | Admitting: Family Medicine

## 2018-05-16 ENCOUNTER — Ambulatory Visit: Payer: Federal, State, Local not specified - PPO | Admitting: Family Medicine

## 2018-05-16 ENCOUNTER — Encounter: Payer: Self-pay | Admitting: Family Medicine

## 2018-05-16 DIAGNOSIS — G43109 Migraine with aura, not intractable, without status migrainosus: Secondary | ICD-10-CM

## 2018-05-16 MED ORDER — TOPIRAMATE 25 MG PO TABS: 50.0000 mg | ORAL_TABLET | Freq: Two times a day (BID) | ORAL | 3 refills | Status: DC

## 2018-05-16 MED ORDER — CYCLOBENZAPRINE HCL 10 MG PO TABS: 10.0000 mg | ORAL_TABLET | Freq: Three times a day (TID) | ORAL | 2 refills | Status: DC | PRN

## 2018-05-16 NOTE — Telephone Encounter (Signed)
Ok to refill 
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
## 2018-08-24 ENCOUNTER — Other Ambulatory Visit: Payer: Self-pay | Admitting: Family Medicine

## 2018-09-01 ENCOUNTER — Other Ambulatory Visit: Payer: Self-pay | Admitting: Family Medicine

## 2018-12-04 ENCOUNTER — Other Ambulatory Visit: Payer: Self-pay

## 2018-12-05 ENCOUNTER — Other Ambulatory Visit: Payer: Self-pay

## 2018-12-05 ENCOUNTER — Ambulatory Visit: Payer: Federal, State, Local not specified - PPO | Admitting: Family Medicine

## 2018-12-05 VITALS — BP 98/60 | HR 86 | Temp 98.6°F | Resp 18 | Ht 61.0 in | Wt 165.0 lb

## 2018-12-05 DIAGNOSIS — I10 Essential (primary) hypertension: Secondary | ICD-10-CM | POA: Diagnosis not present

## 2018-12-05 DIAGNOSIS — G43109 Migraine with aura, not intractable, without status migrainosus: Secondary | ICD-10-CM | POA: Diagnosis not present

## 2018-12-05 DIAGNOSIS — E782 Mixed hyperlipidemia: Secondary | ICD-10-CM | POA: Diagnosis not present

## 2018-12-05 MED ORDER — AIMOVIG 70 MG/ML ~~LOC~~ SOAJ: 70.0000 mg | SUBCUTANEOUS | 3 refills | Status: DC

## 2018-12-05 MED ORDER — OMEPRAZOLE 40 MG PO CPDR: 40.0000 mg | DELAYED_RELEASE_CAPSULE | Freq: Two times a day (BID) | ORAL | 3 refills | Status: DC

## 2018-12-05 MED ORDER — CHANTIX STARTING MONTH PAK 0.5 MG X 11 & 1 MG X 42 PO TABS: ORAL_TABLET | ORAL | 0 refills | Status: DC
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
## 2018-12-11 ENCOUNTER — Encounter: Payer: Self-pay | Admitting: Family Medicine

## 2018-12-12 MED ORDER — BUTALBITAL-APAP-CAFFEINE 50-325-40 MG PO TABS: 1.0000 | ORAL_TABLET | Freq: Four times a day (QID) | ORAL | 0 refills | Status: DC | PRN

## 2018-12-12 NOTE — Telephone Encounter (Signed)
Please send Fioricet to pharm.

## 2019-01-23 ENCOUNTER — Other Ambulatory Visit: Payer: Self-pay

## 2019-01-23 MED ORDER — LOSARTAN POTASSIUM 100 MG PO TABS: 100.0000 mg | ORAL_TABLET | Freq: Every day | ORAL | 0 refills | Status: DC

## 2019-01-23 MED ORDER — BUTALBITAL-APAP-CAFFEINE 50-325-40 MG PO TABS: 1.0000 | ORAL_TABLET | Freq: Four times a day (QID) | ORAL | 0 refills | Status: DC | PRN

## 2019-01-23 MED ORDER — METOPROLOL SUCCINATE ER 50 MG PO TB24: ORAL_TABLET | ORAL | 0 refills | Status: DC

## 2019-01-23 MED ORDER — ATORVASTATIN CALCIUM 40 MG PO TABS: 40.0000 mg | ORAL_TABLET | Freq: Every day | ORAL | 0 refills | Status: DC

## 2019-01-23 NOTE — Telephone Encounter (Signed)
Requested Prescriptions   Pending Prescriptions Disp Refills  . butalbital-acetaminophen-caffeine (FIORICET) 50-325-40 MG tablet 20 tablet 0    Sig: Take 1-2 tablets by mouth every 6 (six) hours as needed for headache.   Signed Prescriptions Disp Refills  . atorvastatin (LIPITOR) 40 MG tablet 90 tablet 0    Sig: Take 1 tablet (40 mg total) by mouth daily.    Authorizing Provider: Jenna Luo T    Ordering User: Vanice Sarah  . metoprolol succinate (TOPROL-XL) 50 MG 24 hr tablet 90 tablet 0    Sig: TAKE 1 TABLET BY MOUTH EVERY DAY WITH OR FOLLOWING A MEAL    Authorizing Provider: Jenna Luo T    Ordering User: Vanice Sarah  . losartan (COZAAR) 100 MG tablet 90 tablet 0    Sig: Take 1 tablet (100 mg total) by mouth daily.    Authorizing Provider: Susy Frizzle    Ordering User: Vanice Sarah    Last OV 12/05/2018   Last written 12/12/2018

## 2019-02-20 ENCOUNTER — Other Ambulatory Visit: Payer: Self-pay | Admitting: Family Medicine

## 2019-02-20 DIAGNOSIS — G43109 Migraine with aura, not intractable, without status migrainosus: Secondary | ICD-10-CM

## 2019-02-27 ENCOUNTER — Other Ambulatory Visit: Payer: Self-pay | Admitting: Family Medicine

## 2019-05-04 ENCOUNTER — Encounter: Payer: Self-pay | Admitting: Family Medicine

## 2019-05-04 ENCOUNTER — Other Ambulatory Visit: Payer: Self-pay | Admitting: Family Medicine

## 2019-05-04 DIAGNOSIS — G43109 Migraine with aura, not intractable, without status migrainosus: Secondary | ICD-10-CM

## 2019-05-04 MED ORDER — HYDROCHLOROTHIAZIDE 25 MG PO TABS: 25.0000 mg | ORAL_TABLET | Freq: Every day | ORAL | 3 refills | Status: DC

## 2019-05-04 NOTE — Telephone Encounter (Signed)
Ok to refill??  Last office visit 12/05/2018.  Last refill 01/23/2019.

## 2019-05-30 DIAGNOSIS — I10 Essential (primary) hypertension: Secondary | ICD-10-CM | POA: Diagnosis not present

## 2019-05-30 DIAGNOSIS — G43909 Migraine, unspecified, not intractable, without status migrainosus: Secondary | ICD-10-CM | POA: Diagnosis not present

## 2019-05-30 DIAGNOSIS — K219 Gastro-esophageal reflux disease without esophagitis: Secondary | ICD-10-CM | POA: Diagnosis not present

## 2019-05-30 DIAGNOSIS — E785 Hyperlipidemia, unspecified: Secondary | ICD-10-CM | POA: Diagnosis not present

## 2019-05-31 DIAGNOSIS — E785 Hyperlipidemia, unspecified: Secondary | ICD-10-CM | POA: Insufficient documentation

## 2019-07-06 ENCOUNTER — Other Ambulatory Visit: Payer: Self-pay | Admitting: Neurology

## 2019-07-06 DIAGNOSIS — G4452 New daily persistent headache (NDPH): Secondary | ICD-10-CM

## 2019-07-10 ENCOUNTER — Other Ambulatory Visit: Payer: Self-pay | Admitting: Family Medicine

## 2019-07-11 DIAGNOSIS — G4452 New daily persistent headache (NDPH): Secondary | ICD-10-CM | POA: Diagnosis not present

## 2019-07-11 DIAGNOSIS — I10 Essential (primary) hypertension: Secondary | ICD-10-CM | POA: Diagnosis not present

## 2019-07-11 DIAGNOSIS — G43909 Migraine, unspecified, not intractable, without status migrainosus: Secondary | ICD-10-CM | POA: Diagnosis not present

## 2019-07-11 DIAGNOSIS — E785 Hyperlipidemia, unspecified: Secondary | ICD-10-CM | POA: Diagnosis not present

## 2019-07-16 ENCOUNTER — Telehealth (HOSPITAL_COMMUNITY): Payer: Self-pay | Admitting: Neurology

## 2019-07-16 NOTE — Telephone Encounter (Signed)
07/16/19~LVM on hm vm. MF

## 2019-08-31 ENCOUNTER — Other Ambulatory Visit: Payer: Self-pay | Admitting: Family Medicine

## 2019-08-31 ENCOUNTER — Encounter: Payer: Self-pay | Admitting: Family Medicine

## 2019-08-31 DIAGNOSIS — G43109 Migraine with aura, not intractable, without status migrainosus: Secondary | ICD-10-CM

## 2019-09-03 NOTE — Telephone Encounter (Signed)
Ok to refill 

## 2019-10-14 IMAGING — DX DG ANKLE COMPLETE 3+V*L*
3 series · 3 of 3 positions shown · non-contrast
Comparison: None.

CLINICAL DATA: Patient slipped off the bottom step and twisted left
ankle.

EXAM:
LEFT ANKLE COMPLETE - 3+ VIEW

[ankle ap]
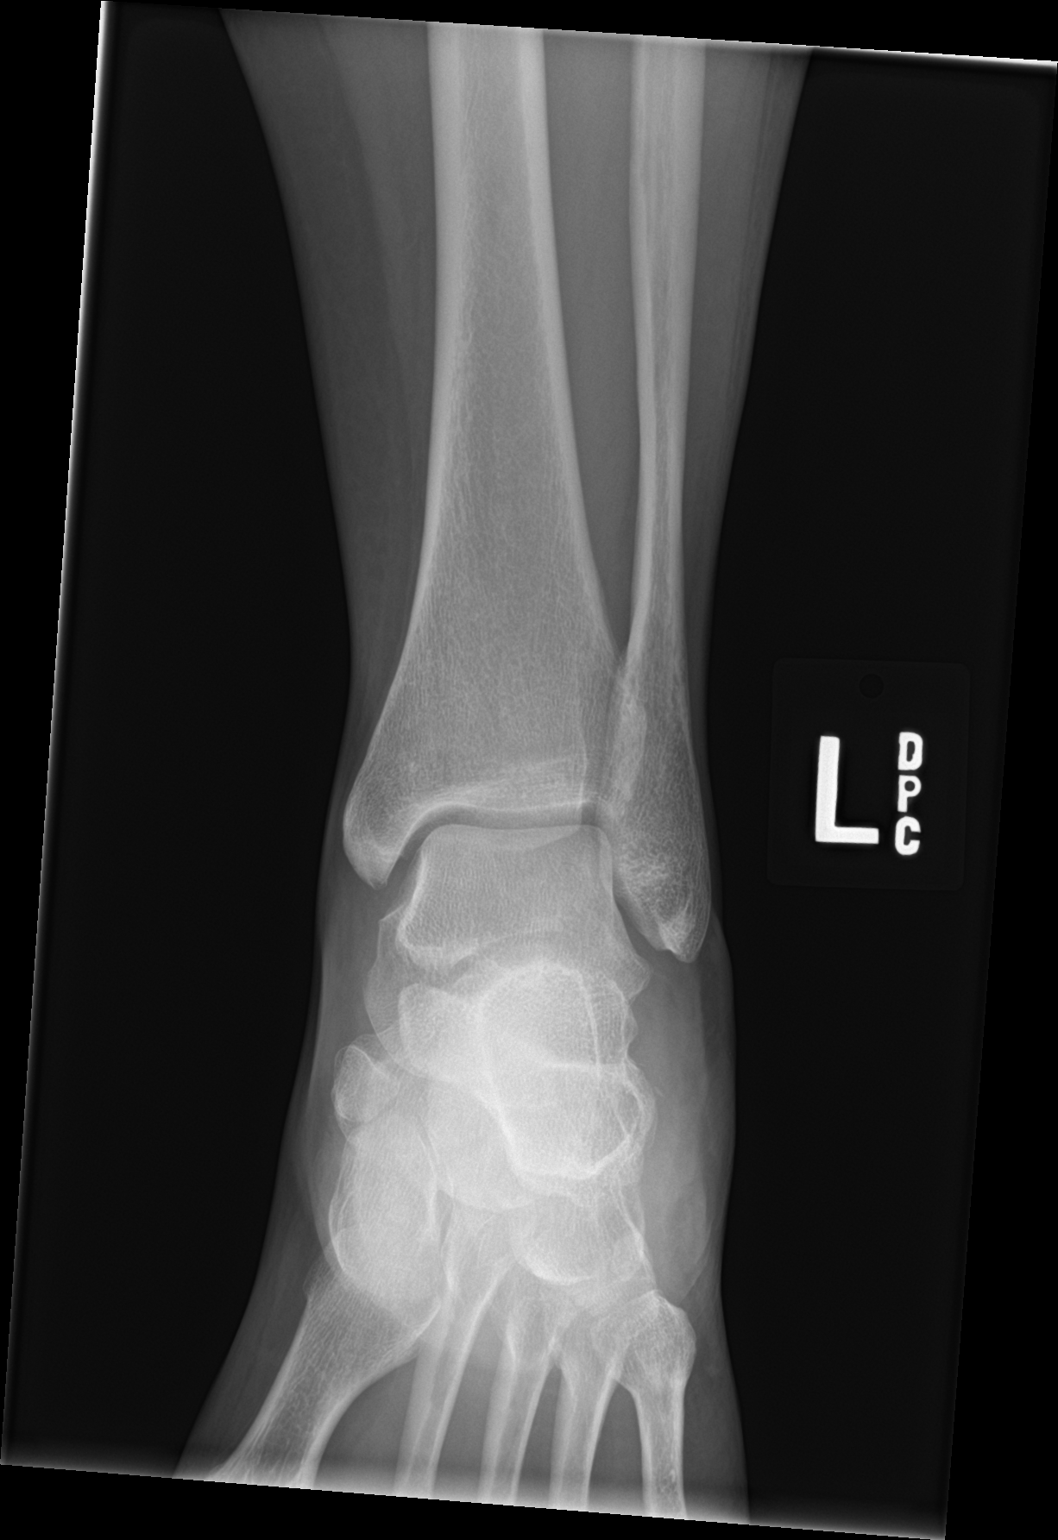

[ankle obl]
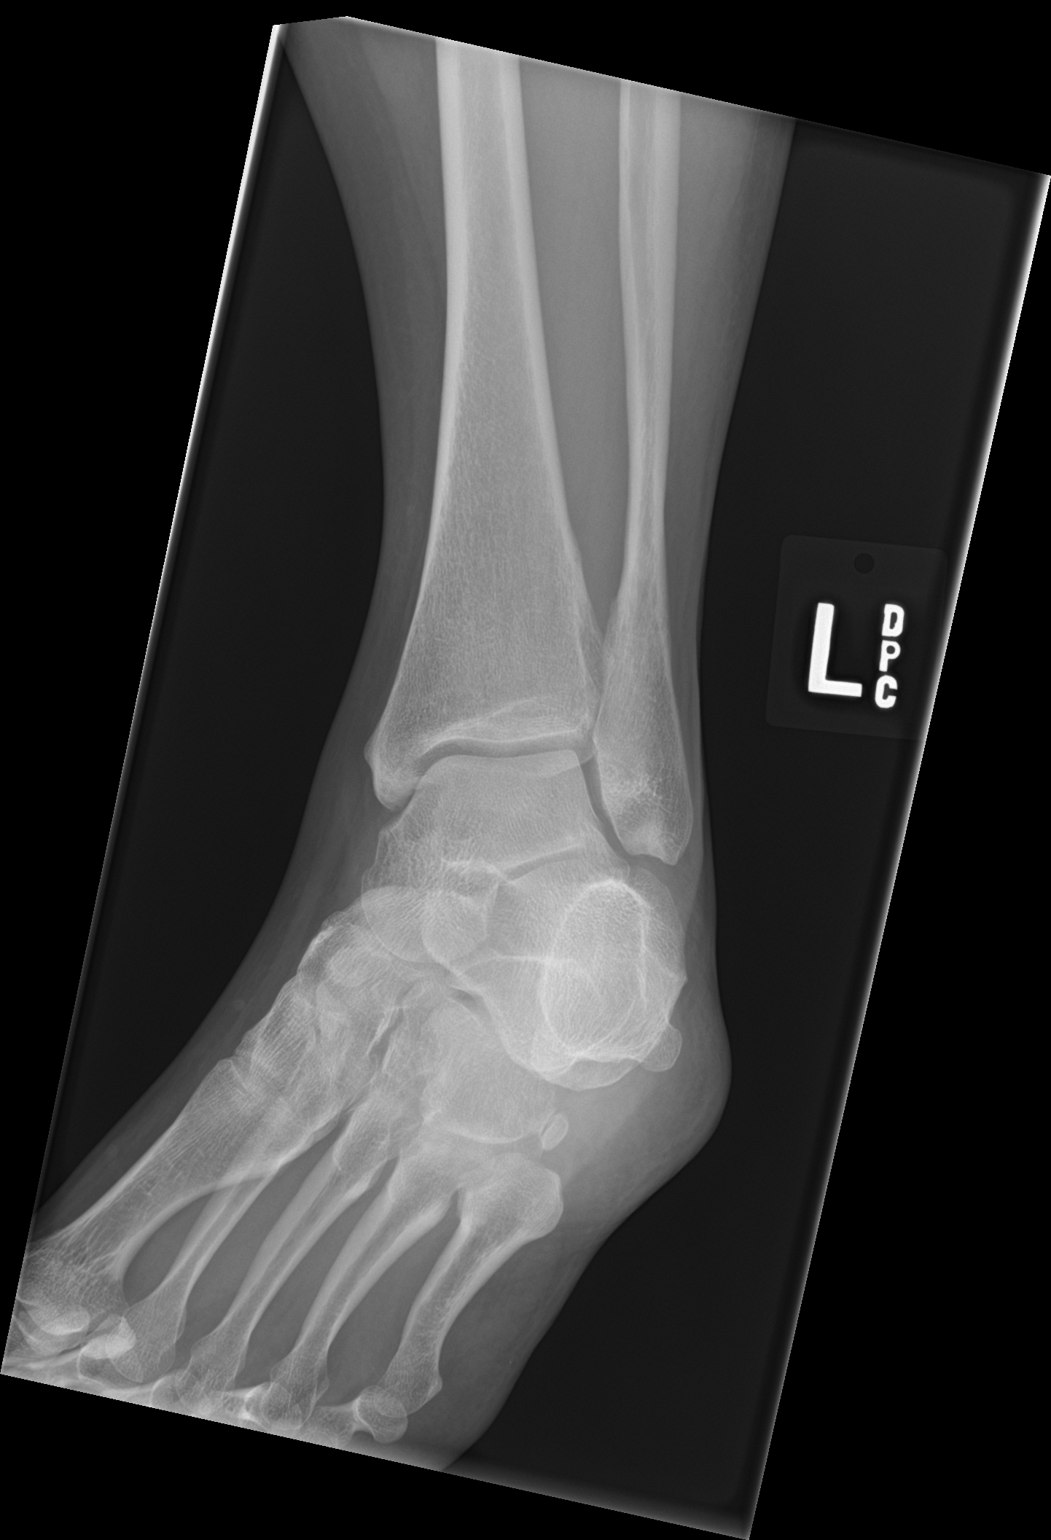

[ankle lat]
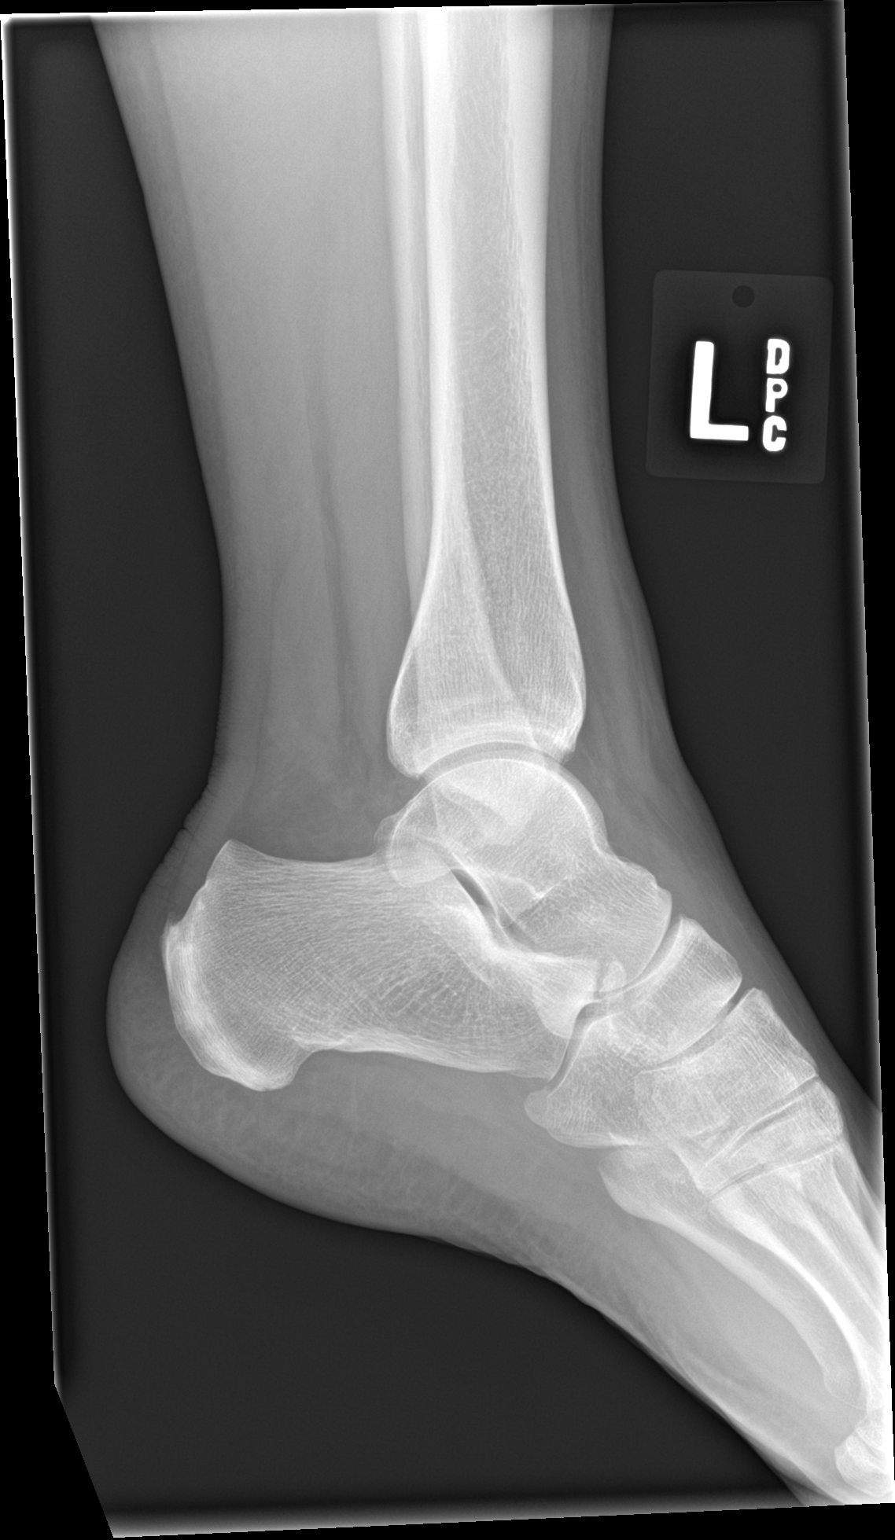

[3 of 3 positions shown; findings below may reference images not displayed]

FINDINGS: Acute fracture of the anterolateral process of the calcaneus. Intact
tibiotalar and subtalar joints. Anterolateral soft tissue swelling
of the midfoot. Small dorsal calcaneal enthesophyte. Base of fifth
metatarsal appears intact.
IMPRESSION: Acute anterolateral process fracture of the calcaneus.

## 2019-11-11 ENCOUNTER — Other Ambulatory Visit: Payer: Self-pay | Admitting: Family Medicine

## 2019-11-12 NOTE — Telephone Encounter (Signed)
Requested Prescriptions   Pending Prescriptions Disp Refills  . butalbital-acetaminophen-caffeine (FIORICET) 50-325-40 MG tablet [Pharmacy Med Name: BUTALBITAL/ACETAMINOPHEN/CAFF TABS] 20 tablet     Sig: TAKE 1 TO 2 TABLETS BY MOUTH EVERY 6 HOURS AS NEEDED FOR HEADACHE     Last OV 12/05/2018   Last written 09/03/2019

## 2020-01-14 ENCOUNTER — Other Ambulatory Visit: Payer: Self-pay | Admitting: Family Medicine

## 2020-01-14 DIAGNOSIS — G43109 Migraine with aura, not intractable, without status migrainosus: Secondary | ICD-10-CM

## 2020-01-15 ENCOUNTER — Other Ambulatory Visit: Payer: Self-pay | Admitting: Family Medicine

## 2020-02-10 ENCOUNTER — Other Ambulatory Visit: Payer: Self-pay | Admitting: Family Medicine

## 2020-03-10 ENCOUNTER — Encounter: Payer: Federal, State, Local not specified - PPO | Admitting: Family Medicine

## 2020-03-21 ENCOUNTER — Other Ambulatory Visit: Payer: Self-pay | Admitting: Family Medicine

## 2020-03-21 ENCOUNTER — Ambulatory Visit (INDEPENDENT_AMBULATORY_CARE_PROVIDER_SITE_OTHER): Payer: Federal, State, Local not specified - PPO | Admitting: Family Medicine

## 2020-03-21 ENCOUNTER — Encounter: Payer: Self-pay | Admitting: Family Medicine

## 2020-03-21 ENCOUNTER — Ambulatory Visit (HOSPITAL_COMMUNITY)
Admission: RE | Admit: 2020-03-21 | Discharge: 2020-03-21 | Disposition: A | Discharge status: Home / Self Care | Payer: Federal, State, Local not specified - PPO | Source: Ambulatory Visit | Attending: Family Medicine | Admitting: Family Medicine

## 2020-03-21 ENCOUNTER — Other Ambulatory Visit: Payer: Self-pay

## 2020-03-21 VITALS — BP 126/78 | HR 68 | Temp 97.6°F | Resp 16 | Ht 61.0 in | Wt 149.0 lb

## 2020-03-21 DIAGNOSIS — I10 Essential (primary) hypertension: Secondary | ICD-10-CM | POA: Diagnosis not present

## 2020-03-21 DIAGNOSIS — G8929 Other chronic pain: Secondary | ICD-10-CM | POA: Diagnosis not present

## 2020-03-21 DIAGNOSIS — M25572 Pain in left ankle and joints of left foot: Secondary | ICD-10-CM | POA: Insufficient documentation

## 2020-03-21 DIAGNOSIS — E782 Mixed hyperlipidemia: Secondary | ICD-10-CM

## 2020-03-21 DIAGNOSIS — Z0000 Encounter for general adult medical examination without abnormal findings: Secondary | ICD-10-CM

## 2020-03-21 DIAGNOSIS — G43109 Migraine with aura, not intractable, without status migrainosus: Secondary | ICD-10-CM

## 2020-03-21 DIAGNOSIS — Z0001 Encounter for general adult medical examination with abnormal findings: Secondary | ICD-10-CM | POA: Diagnosis not present

## 2020-03-21 DIAGNOSIS — Z23 Encounter for immunization: Secondary | ICD-10-CM

## 2020-03-21 MED ORDER — BUTALBITAL-APAP-CAFFEINE 50-325-40 MG PO TABS: 1.0000 | ORAL_TABLET | Freq: Four times a day (QID) | ORAL | 0 refills | Status: DC | PRN

## 2020-03-21 MED ORDER — CELECOXIB 200 MG PO CAPS: 200.0000 mg | ORAL_CAPSULE | Freq: Two times a day (BID) | ORAL | 1 refills | Status: DC

## 2020-03-21 MED ORDER — DULOXETINE HCL 60 MG PO CPEP: 60.0000 mg | ORAL_CAPSULE | Freq: Every day | ORAL | 3 refills | Status: DC

## 2020-03-21 MED ORDER — OMEPRAZOLE 40 MG PO CPDR: DELAYED_RELEASE_CAPSULE | ORAL | 3 refills | Status: DC

## 2020-03-21 MED ORDER — AMITRIPTYLINE HCL 50 MG PO TABS: 50.0000 mg | ORAL_TABLET | Freq: Every day | ORAL | 5 refills | Status: DC
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
## 2020-03-21 NOTE — Telephone Encounter (Signed)
Ok to refill 

## 2020-03-22 LAB — COMPLETE METABOLIC PANEL WITH GFR
AG Ratio: 1.6 (calc) (ref 1.0–2.5)
ALT: 12 U/L (ref 6–29)
AST: 14 U/L (ref 10–35)
Albumin: 4 g/dL (ref 3.6–5.1)
Alkaline phosphatase (APISO): 75 U/L (ref 31–125)
BUN/Creatinine Ratio: 11 (calc) (ref 6–22)
BUN: 14 mg/dL (ref 7–25)
CO2: 21 mmol/L (ref 20–32)
Calcium: 9.3 mg/dL (ref 8.6–10.2)
Chloride: 112 mmol/L — ABNORMAL HIGH (ref 98–110)
Creat: 1.25 mg/dL — ABNORMAL HIGH (ref 0.50–1.10)
GFR, Est African American: 60 mL/min/{1.73_m2} (ref >=60)
GFR, Est Non African American: 52 mL/min/{1.73_m2} — ABNORMAL LOW (ref >=60)
Globulin: 2.5 g/dL (calc) (ref 1.9–3.7)
Glucose, Bld: 91 mg/dL (ref 65–99)
Potassium: 3.9 mmol/L (ref 3.5–5.3)
Sodium: 142 mmol/L (ref 135–146)
Total Bilirubin: 0.2 mg/dL (ref 0.2–1.2)
Total Protein: 6.5 g/dL (ref 6.1–8.1)

## 2020-03-22 LAB — CBC WITH DIFFERENTIAL/PLATELET
Absolute Monocytes: 571 cells/uL (ref 200–950)
Basophils Absolute: 50 cells/uL (ref 0–200)
Basophils Relative: 0.6 %
Eosinophils Absolute: 92 cells/uL (ref 15–500)
Eosinophils Relative: 1.1 %
HCT: 39 % (ref 35.0–45.0)
Hemoglobin: 13.3 g/dL (ref 11.7–15.5)
Lymphs Abs: 2528 cells/uL (ref 850–3900)
MCH: 33.8 pg — ABNORMAL HIGH (ref 27.0–33.0)
MCHC: 34.1 g/dL (ref 32.0–36.0)
MCV: 99.2 fL (ref 80.0–100.0)
MPV: 10.1 fL (ref 7.5–12.5)
Monocytes Relative: 6.8 %
Neutro Abs: 5158 cells/uL (ref 1500–7800)
Neutrophils Relative %: 61.4 %
Platelets: 402 10*3/uL — ABNORMAL HIGH (ref 140–400)
RBC: 3.93 10*6/uL (ref 3.80–5.10)
RDW: 12.4 % (ref 11.0–15.0)
Total Lymphocyte: 30.1 %
WBC: 8.4 10*3/uL (ref 3.8–10.8)

## 2020-03-22 LAB — LIPID PANEL
Cholesterol: 185 mg/dL (ref <200)
HDL: 39 mg/dL — ABNORMAL LOW (ref >=50)
Non-HDL Cholesterol (Calc): 146 mg/dL (calc) — ABNORMAL HIGH (ref <130)
Total CHOL/HDL Ratio: 4.7 (calc) (ref <5.0)
Triglycerides: 452 mg/dL — ABNORMAL HIGH (ref <150)

## 2020-03-25 ENCOUNTER — Encounter: Payer: Self-pay | Admitting: Family Medicine

## 2020-04-15 ENCOUNTER — Other Ambulatory Visit: Payer: Self-pay | Admitting: Family Medicine

## 2020-04-15 ENCOUNTER — Ambulatory Visit: Payer: Federal, State, Local not specified - PPO | Admitting: Family Medicine

## 2020-04-22 ENCOUNTER — Ambulatory Visit: Payer: Federal, State, Local not specified - PPO | Admitting: Family Medicine

## 2020-07-06 ENCOUNTER — Other Ambulatory Visit: Payer: Self-pay | Admitting: Family Medicine

## 2020-08-16 ENCOUNTER — Other Ambulatory Visit: Payer: Self-pay | Admitting: Family Medicine

## 2020-08-16 DIAGNOSIS — G43109 Migraine with aura, not intractable, without status migrainosus: Secondary | ICD-10-CM

## 2020-08-18 NOTE — Telephone Encounter (Signed)
Ok to refill 

## 2020-08-27 ENCOUNTER — Other Ambulatory Visit: Payer: Self-pay

## 2020-08-27 ENCOUNTER — Ambulatory Visit
Admission: EM | Admit: 2020-08-27 | Discharge: 2020-08-27 | Disposition: A | Discharge status: Home / Self Care | Payer: Federal, State, Local not specified - PPO | Attending: Family Medicine | Admitting: Family Medicine

## 2020-08-27 DIAGNOSIS — R110 Nausea: Secondary | ICD-10-CM

## 2020-08-27 DIAGNOSIS — R059 Cough, unspecified: Secondary | ICD-10-CM

## 2020-08-27 DIAGNOSIS — U071 COVID-19: Secondary | ICD-10-CM

## 2020-08-27 MED ORDER — HYDROCOD POLST-CPM POLST ER 10-8 MG/5ML PO SUER: 5.0000 mL | Freq: Every evening | ORAL | 0 refills | Status: DC | PRN

## 2020-08-27 MED ORDER — ONDANSETRON 4 MG PO TBDP: 4.0000 mg | ORAL_TABLET | Freq: Three times a day (TID) | ORAL | 0 refills | Status: DC | PRN
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
## 2020-08-27 NOTE — Discharge Instructions (Addendum)
Be aware, your cough medication may cause drowsiness. Please do not drive, operate heavy machinery or make important decisions while on this medication, it can cloud your judgement.  

## 2020-08-27 NOTE — ED Triage Notes (Signed)
Pt presents with cough , body aches and sore throat that began on Thursday

## 2020-11-08 ENCOUNTER — Ambulatory Visit
Admission: EM | Admit: 2020-11-08 | Discharge: 2020-11-08 | Disposition: A | Discharge status: Home / Self Care | Payer: Federal, State, Local not specified - PPO | Attending: Family Medicine | Admitting: Family Medicine

## 2020-11-08 ENCOUNTER — Other Ambulatory Visit: Payer: Self-pay

## 2020-11-08 DIAGNOSIS — J069 Acute upper respiratory infection, unspecified: Secondary | ICD-10-CM | POA: Diagnosis not present

## 2020-11-08 DIAGNOSIS — J029 Acute pharyngitis, unspecified: Secondary | ICD-10-CM | POA: Diagnosis not present

## 2020-11-08 DIAGNOSIS — R002 Palpitations: Secondary | ICD-10-CM

## 2020-11-08 LAB — POCT RAPID STREP A (OFFICE): Rapid Strep A Screen: NEGATIVE

## 2020-11-08 MED ORDER — AMOXICILLIN 875 MG PO TABS: 875.0000 mg | ORAL_TABLET | Freq: Two times a day (BID) | ORAL | 0 refills | Status: DC

## 2020-11-08 MED ORDER — PREDNISONE 10 MG PO TABS: 10.0000 mg | ORAL_TABLET | Freq: Every day | ORAL | 0 refills | Status: AC

## 2020-11-08 MED ORDER — BENZONATATE 100 MG PO CAPS: 100.0000 mg | ORAL_CAPSULE | Freq: Three times a day (TID) | ORAL | 0 refills | Status: DC | PRN

## 2020-11-08 NOTE — Discharge Instructions (Addendum)
EKG here today show no acute abnormalities . Follow-up with our PCP regarding concern of heart palpations. If symptoms, worsen, go to the nearest Emergency Department. For upper respiratory symptoms take medication as prescribed.

## 2020-11-08 NOTE — ED Triage Notes (Signed)
Pt presents with c/o nasal congestion and sore throat that began on Monday, pt states she had 2 negative covid test
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
## 2020-12-25 ENCOUNTER — Other Ambulatory Visit: Payer: Self-pay | Admitting: Family Medicine

## 2020-12-25 ENCOUNTER — Other Ambulatory Visit: Payer: Self-pay | Admitting: Nurse Practitioner

## 2020-12-25 ENCOUNTER — Other Ambulatory Visit: Payer: Self-pay

## 2020-12-25 DIAGNOSIS — G43109 Migraine with aura, not intractable, without status migrainosus: Secondary | ICD-10-CM

## 2021-02-26 ENCOUNTER — Other Ambulatory Visit: Payer: Self-pay

## 2021-02-26 ENCOUNTER — Ambulatory Visit: Payer: Federal, State, Local not specified - PPO | Admitting: Family Medicine

## 2021-02-26 ENCOUNTER — Encounter: Payer: Self-pay | Admitting: Family Medicine

## 2021-02-26 VITALS — BP 102/62 | HR 78 | Ht 61.0 in | Wt 135.0 lb

## 2021-02-26 DIAGNOSIS — G43909 Migraine, unspecified, not intractable, without status migrainosus: Secondary | ICD-10-CM

## 2021-02-26 MED ORDER — TROKENDI XR 100 MG PO CP24: 200.0000 mg | ORAL_CAPSULE | Freq: Every day | ORAL | 3 refills | Status: DC

## 2021-02-26 MED ORDER — PREDNISONE 20 MG PO TABS: ORAL_TABLET | ORAL | 0 refills | Status: DC
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
## 2021-03-04 ENCOUNTER — Other Ambulatory Visit: Payer: Self-pay

## 2021-03-04 ENCOUNTER — Encounter: Payer: Self-pay | Admitting: Neurology

## 2021-03-04 ENCOUNTER — Ambulatory Visit: Payer: Federal, State, Local not specified - PPO | Admitting: Neurology

## 2021-03-04 VITALS — BP 114/63 | HR 73 | Ht 61.0 in | Wt 135.8 lb

## 2021-03-04 DIAGNOSIS — G43119 Migraine with aura, intractable, without status migrainosus: Secondary | ICD-10-CM | POA: Diagnosis not present

## 2021-03-04 MED ORDER — EMGALITY 120 MG/ML ~~LOC~~ SOAJ: 120.0000 mg | SUBCUTANEOUS | 5 refills | Status: DC

## 2021-03-04 MED ORDER — EMGALITY 120 MG/ML ~~LOC~~ SOAJ: 240.0000 mg | Freq: Once | SUBCUTANEOUS | 0 refills | Status: AC

## 2021-03-04 NOTE — Patient Instructions (Addendum)
°  Contact your insurance to find out which of following medications are formulary  Emgality, Ajovy - let me know and I will send a prescrition Since topiramate was ineffective, don't even restart it. Take Nurtec at earliest onset of headache.  Maximum 1 in 24 hours.  If effective, let me know and I will send in a prescription. Limit use of pain relievers to no more than 2 days out of the week.  These medications include acetaminophen, NSAIDs (ibuprofen/Advil/Motrin, naproxen/Aleve, triptans (Imitrex/sumatriptan), Excedrin, and narcotics.  This will help reduce risk of rebound headaches. Be aware of common food triggers:  - Caffeine:  coffee, black tea, cola, Mt. Dew  - Chocolate  - Dairy:  aged cheeses (brie, blue, cheddar, gouda, [place], provolone, [place], Swiss, etc), chocolate milk, buttermilk, sour cream, limit eggs and yogurt  - Nuts, peanut butter  - Alcohol  - Cereals/grains:  FRESH breads (fresh bagels, sourdough, doughnuts), yeast productions  - Processed/canned/aged/cured meats (pre-packaged deli meats, hotdogs)  - MSG/glutamate:  soy sauce, flavor enhancer, pickled/preserved/marinated foods  - Sweeteners:  aspartame (Equal, Nutrasweet).  Sugar and Splenda are okay  - Vegetables:  legumes (lima beans, lentils, snow peas, fava beans, pinto peans, peas, garbanzo beans), sauerkraut, onions, olives, pickles  - Fruit:  avocados, bananas, citrus fruit (orange, lemon, grapefruit), mango  - Other:  Frozen meals, macaroni and cheese Routine exercise Stay adequately hydrated (aim for 64 oz water daily) Keep headache diary Maintain proper stress management Maintain proper sleep hygiene Do not skip meals Consider supplements:  magnesium citrate 400mg  daily, riboflavin 400mg  daily, coenzyme Q10 100mg  three times daily.

## 2021-03-05 ENCOUNTER — Ambulatory Visit: Payer: Federal, State, Local not specified - PPO | Admitting: Family Medicine

## 2021-03-05 ENCOUNTER — Encounter: Payer: Self-pay | Admitting: Family Medicine

## 2021-03-05 VITALS — BP 108/72 | HR 81 | Temp 97.5°F | Resp 18 | Ht 61.0 in | Wt 135.0 lb

## 2021-03-05 DIAGNOSIS — Z8659 Personal history of other mental and behavioral disorders: Secondary | ICD-10-CM | POA: Insufficient documentation

## 2021-03-05 DIAGNOSIS — M5430 Sciatica, unspecified side: Secondary | ICD-10-CM | POA: Insufficient documentation

## 2021-03-05 DIAGNOSIS — G43909 Migraine, unspecified, not intractable, without status migrainosus: Secondary | ICD-10-CM

## 2021-03-05 MED ORDER — NURTEC 75 MG PO TBDP: 1.0000 | ORAL_TABLET | Freq: Once | ORAL | 3 refills | Status: DC | PRN
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
## 2021-03-16 ENCOUNTER — Telehealth: Payer: Self-pay

## 2021-03-16 NOTE — Telephone Encounter (Signed)
New message   WILLIETTE LOEWE (Key: [id number]) Emgality 120MG /ML auto-injectors (migraine)   Form PA Form [phone] NCPDP) Created 9 minutes ago Sent to Plan 8 minutes ago Plan Response 8 minutes ago Submit Clinical Questions less than a minute ago Determination Favorable less than a minute ago Message from (2025 Your PA request has been approved. Additional information will be provided in the approval communication. (Message 1145)  Message: The Prior Authorization request has been approved for Emgality 120MG /ML [place] SOAJ. The authorization is valid from 02/14/2021 through 09/12/2021. A letter of explanation will also be mailed to the patient

## 2021-03-16 NOTE — Telephone Encounter (Signed)
New message   Jenna Jones (Key: [id number])Need help? Call us at [phone] Outcome N/A Your PA request cannot be processed electronically. For further inquiries please contact the number on the back of the member prescription card. (Message [phone]) Drug Bernita Raisin 100MG  tablets Form Program (FEP) Prior Authorization Form Prior Authorizations for members of the [employer] Program [phone] To initiate an authorization request for this medication, please [phone] Program (FEP) at [phone].

## 2021-03-27 ENCOUNTER — Other Ambulatory Visit: Payer: Self-pay | Admitting: Family Medicine

## 2021-03-31 ENCOUNTER — Other Ambulatory Visit: Payer: Self-pay | Admitting: Family Medicine

## 2021-03-31 DIAGNOSIS — G43109 Migraine with aura, not intractable, without status migrainosus: Secondary | ICD-10-CM

## 2021-04-02 ENCOUNTER — Other Ambulatory Visit: Payer: Self-pay | Admitting: Family Medicine

## 2021-04-02 DIAGNOSIS — G43109 Migraine with aura, not intractable, without status migrainosus: Secondary | ICD-10-CM

## 2021-04-02 MED ORDER — CYCLOBENZAPRINE HCL 10 MG PO TABS: ORAL_TABLET | ORAL | 0 refills | Status: DC

## 2021-06-01 ENCOUNTER — Telehealth: Payer: Self-pay

## 2021-06-01 MED ORDER — RIZATRIPTAN BENZOATE 10 MG PO TABS: 10.0000 mg | ORAL_TABLET | ORAL | 5 refills | Status: DC | PRN

## 2021-06-01 NOTE — Telephone Encounter (Signed)
PER Dr.Jaffe Please send rizatriptan (not the dissolvable) 10mg  - take 1 tablet earliest onset of headache.  May repeat after 2 hours.  Maximum 2 tablets in 24 hours. If you you would like to try this let me know and I will send it in to your pharmacy  ?  ?

## 2021-06-04 ENCOUNTER — Other Ambulatory Visit: Payer: Self-pay

## 2021-06-04 ENCOUNTER — Other Ambulatory Visit: Payer: Self-pay | Admitting: Family Medicine

## 2021-06-12 ENCOUNTER — Other Ambulatory Visit: Payer: Self-pay | Admitting: Neurology

## 2021-06-12 MED ORDER — AIMOVIG 140 MG/ML ~~LOC~~ SOAJ: 140.0000 mg | SUBCUTANEOUS | 5 refills | Status: DC

## 2021-06-14 ENCOUNTER — Other Ambulatory Visit: Payer: Self-pay | Admitting: Neurology

## 2021-06-15 ENCOUNTER — Other Ambulatory Visit: Payer: Self-pay | Admitting: Neurology

## 2021-06-15 MED ORDER — SUMATRIPTAN 20 MG/ACT NA SOLN: NASAL | 5 refills | Status: DC

## 2021-06-23 ENCOUNTER — Telehealth (HOSPITAL_COMMUNITY): Payer: Self-pay | Admitting: Pharmacy Technician

## 2021-06-23 NOTE — Telephone Encounter (Signed)
Patient Advocate Encounter ?  ?Received notification that prior authorization for Aimovig 140MG /ML auto-injectors is required. ?  ?PA submitted on 06/23/2021 ?Key 08/23/2021 ?Status is pending ?   ? ? ? ?[id number], CPhT ?Pharmacy Patient Advocate Specialist ?[facility] Patient Advocate Team ?Direct Number: [phone]  Fax: [phone]  ?

## 2021-06-24 NOTE — Telephone Encounter (Signed)
Patient Advocate Encounter ? ?Prior Authorization for Aimovig 140MG /ML auto-injectors has been approved.   ? ?PA# ?Effective dates: 05/24/2021 through 12/20/2021 ? ? ? ? ? ?12/22/2021, CPhT ?Pharmacy Patient Advocate Specialist ?[facility] Patient Advocate Team ?Direct Number: [phone]  Fax: [phone]  ?

## 2021-07-21 ENCOUNTER — Encounter: Payer: Self-pay | Admitting: Neurology

## 2021-07-28 ENCOUNTER — Other Ambulatory Visit: Payer: Self-pay

## 2021-07-28 DIAGNOSIS — R519 Headache, unspecified: Secondary | ICD-10-CM

## 2021-07-28 NOTE — Progress Notes (Signed)
Per Dr.Jaffe, For worsening headaches, we can order MRI of brain with and without contrast.  MRI Ordered.

## 2021-08-13 ENCOUNTER — Ambulatory Visit
Admission: RE | Admit: 2021-08-13 | Discharge: 2021-08-13 | Disposition: A | Discharge status: Home / Self Care | Payer: Federal, State, Local not specified - PPO | Source: Ambulatory Visit | Attending: Neurology | Admitting: Neurology

## 2021-08-13 DIAGNOSIS — R519 Headache, unspecified: Secondary | ICD-10-CM | POA: Diagnosis not present

## 2021-08-13 MED ORDER — GADOBENATE DIMEGLUMINE 529 MG/ML IV SOLN
13.0000 mL | Freq: Once | INTRAVENOUS | Status: AC | PRN
  Administered 2021-08-13: 13 mL via INTRAVENOUS

## 2021-08-28 ENCOUNTER — Other Ambulatory Visit: Payer: Self-pay | Admitting: Family Medicine
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
## 2021-09-09 ENCOUNTER — Encounter: Payer: Self-pay | Admitting: Neurology

## 2021-09-09 ENCOUNTER — Ambulatory Visit: Payer: Federal, State, Local not specified - PPO | Admitting: Neurology

## 2021-09-09 VITALS — BP 116/77 | HR 90 | Ht 61.0 in | Wt 140.8 lb

## 2021-09-09 DIAGNOSIS — G43719 Chronic migraine without aura, intractable, without status migrainosus: Secondary | ICD-10-CM

## 2021-09-09 MED ORDER — RIZATRIPTAN BENZOATE 10 MG PO TBDP: 10.0000 mg | ORAL_TABLET | ORAL | 5 refills | Status: DC | PRN

## 2021-09-09 NOTE — Patient Instructions (Signed)
  Start Botox Take rizatriptan 10mg  at earliest onset of headache.  May repeat dose once in 2 hours if needed.  Maximum 2 tablets in 24 hours. Limit use of pain relievers to no more than 2 days out of the week.  These medications include acetaminophen, NSAIDs (ibuprofen/Advil/Motrin, naproxen/Aleve, triptans (Imitrex/sumatriptan), Excedrin, and narcotics.  This will help reduce risk of rebound headaches. Be aware of common food triggers:  - Caffeine:  coffee, black tea, cola, Mt. Dew  - Chocolate  - Dairy:  aged cheeses (brie, blue, cheddar, gouda, [place], provolone, [place], Swiss, etc), chocolate milk, buttermilk, sour cream, limit eggs and yogurt  - Nuts, peanut butter  - Alcohol  - Cereals/grains:  FRESH breads (fresh bagels, sourdough, doughnuts), yeast productions  - Processed/canned/aged/cured meats (pre-packaged deli meats, hotdogs)  - MSG/glutamate:  soy sauce, flavor enhancer, pickled/preserved/marinated foods  - Sweeteners:  aspartame (Equal, Nutrasweet).  Sugar and Splenda are okay  - Vegetables:  legumes (lima beans, lentils, snow peas, fava beans, pinto peans, peas, garbanzo beans), sauerkraut, onions, olives, pickles  - Fruit:  avocados, bananas, citrus fruit (orange, lemon, grapefruit), mango  - Other:  Frozen meals, macaroni and cheese Routine exercise Stay adequately hydrated (aim for 64 oz water daily) Keep headache diary Maintain proper stress management Maintain proper sleep hygiene Do not skip meals Consider supplements:  magnesium citrate 400mg  daily, riboflavin 400mg  daily, coenzyme Q10 100mg  three times daily.

## 2021-09-29 ENCOUNTER — Other Ambulatory Visit: Payer: Self-pay | Admitting: Family Medicine

## 2021-09-30 ENCOUNTER — Other Ambulatory Visit: Payer: Self-pay

## 2021-09-30 ENCOUNTER — Other Ambulatory Visit: Payer: Self-pay | Admitting: Family Medicine

## 2021-09-30 MED ORDER — TOPIRAMATE 50 MG PO TABS: 50.0000 mg | ORAL_TABLET | Freq: Two times a day (BID) | ORAL | 0 refills | Status: DC

## 2021-09-30 MED ORDER — ATORVASTATIN CALCIUM 40 MG PO TABS: ORAL_TABLET | ORAL | 0 refills | Status: DC

## 2021-09-30 NOTE — Telephone Encounter (Signed)
Requested medications are due for refill today.  yes  Requested medications are on the active medications list.  yes  Last refill. Atorvastatin  12/25/2020 #90  1 refill, Celebrex 07/29/2020 #180 1 refill, Losartan 07/29/2020 #90 1 refill  Future visit scheduled.   no  Notes to clinic.  Failed refill protocol d/t expired labs.    Requested Prescriptions  Pending Prescriptions Disp Refills   atorvastatin (LIPITOR) 40 MG tablet [Pharmacy Med Name: ATORVASTATIN 40MG TABLETS] 90 tablet 1    Sig: TAKE 1 TABLET(40 MG) BY MOUTH DAILY     Cardiovascular:  Antilipid - Statins Failed - 09/29/2021  7:29 PM      Failed - Lipid Panel in normal range within the last 12 months    Cholesterol, Total  Date Value Ref Range Status  01/20/2017 226 (H) 100 - 199 mg/dL Final   Cholesterol  Date Value Ref Range Status  03/21/2020 185 <200 mg/dL Final   LDL Cholesterol (Calc)  Date Value Ref Range Status  03/21/2020  mg/dL (calc) Final    Comment:    . LDL cholesterol not calculated. Triglyceride levels greater than 400 mg/dL invalidate calculated LDL results. . Reference range: <100 . Desirable range <100 mg/dL for primary prevention;   <70 mg/dL for patients with CHD or diabetic patients  with > or = 2 CHD risk factors. Marland Kitchen LDL-C is now calculated using the Lafata-Hopkins  calculation, which is a validated novel method providing  better accuracy than the Friedewald equation in the  estimation of LDL-C.  Cresenciano Genre et al. Annamaria Helling. 1245;809(98): 2061-2068  (http://education.[facility].com/faq/FAQ164)    HDL  Date Value Ref Range Status  03/21/2020 39 (L) > OR = 50 mg/dL Final  01/20/2017 42 >39 mg/dL Final   Triglycerides  Date Value Ref Range Status  03/21/2020 452 (H) <150 mg/dL Final    Comment:    . If a non-fasting specimen was collected, consider repeat triglyceride testing on a fasting specimen if clinically indicated.  Yates Decamp et al. J. of Clin. Lipidol. 3382;5:053-976. Marland Kitchen           Passed - Patient is not pregnant      Passed - Valid encounter within last 12 months    Recent Outpatient Visits           6 months ago Nonintractable chronic migraine   [place] Pickard, Cammie Mcgee, MD   7 months ago Nonintractable chronic migraine   [place] Pickard, Cammie Mcgee, MD   1 year ago General medical exam   [place] Susy Frizzle, MD   2 years ago Essential hypertension   [place], Warren T, MD   3 years ago Migraine with aura and without status migrainosus, not intractable   [place] Pickard, Cammie Mcgee, MD               celecoxib (CELEBREX) 200 MG capsule [Pharmacy Med Name: CELECOXIB 200MG CAPSULES] 180 capsule 1    Sig: TAKE 1 CAPSULE(200 MG) BY MOUTH TWICE DAILY     Analgesics:  COX2 Inhibitors Failed - 09/29/2021  7:29 PM      Failed - Manual Review: Labs are only required if the patient has taken medication for more than 8 weeks.      Failed - HGB in normal range and within 360 days    Hemoglobin  Date Value Ref Range Status  03/21/2020 13.3 11.7 - 15.5 g/dL Final  01/20/2017 11.6 11.1 - 15.9 g/dL Final         Failed - Cr in normal range and within 360 days    Creat  Date Value Ref Range Status  03/21/2020 1.25 (H) 0.50 - 1.10 mg/dL Final         Failed - HCT in normal range and within 360 days    HCT  Date Value Ref Range Status  03/21/2020 39.0 35.0 - 45.0 % Final   Hematocrit  Date Value Ref Range Status  01/20/2017 35.5 34.0 - 46.6 % Final         Failed - AST in normal range and within 360 days    AST  Date Value Ref Range Status  03/21/2020 14 10 - 35 U/L Final         Failed - ALT in normal range and within 360 days    ALT  Date Value Ref Range Status  03/21/2020 12 6 - 29 U/L Final         Failed - eGFR is 30 or above and within 360 days    GFR, Est African American  Date Value Ref Range Status  03/21/2020  60 > OR = 60 mL/min/1.11m Final   GFR, Est Non African American  Date Value Ref Range Status  03/21/2020 52 (L) > OR = 60 mL/min/1.74mFinal         Passed - Patient is not pregnant      Passed - Valid encounter within last 12 months    Recent Outpatient Visits           6 months ago Nonintractable chronic migraine   [place], [place] Mcgee[place]   7 months ago Nonintractable chronic migraine   [place] McgeeMD   1 year ago General medical exam   [facility] [place] Fri[place]   2 years ago Essential hypertension   [place] T, MD   3 years ago Migraine with aura and without status migrainosus, not intractable   [place], Wa[place]ammie McgeeMD               losartan (COZAAR) 100 MG tablet [Pharmacy Med Name: LOSARTAN 100MG TABLETS] 90 tablet 1    Sig: TAKE 1 TABLET BY MOUTH DAILY     Cardiovascular:  Angiotensin Receptor Blockers Failed - 09/29/2021  7:29 PM      Failed - Cr in normal range and within 180 days    Creat  Date Value Ref Range Status  03/21/2020 1.25 (H) 0.50 - 1.10 mg/dL Final         Failed - K in normal range and within 180 days    Potassium  Date Value Ref Range Status  03/21/2020 3.9 3.5 - 5.3 mmol/L Final         Failed - Valid encounter within last 6 months    Recent Outpatient Visits           6 months ago Nonintractable chronic migraine   Br[place]ren T, MD   7 months ago Nonintractable chronic migraine   [place], Wa[place]ammie McgeeMD   1 year ago General medical exam   [place] FrizzleMD   2 years ago Essential hypertension   [place], [place]   3 years ago Migraine  with aura and without status migrainosus, not intractable   [place] Pickard, Cammie Mcgee, MD               Passed - Patient is not pregnant      Passed - Last BP in normal range    BP Readings from Last 1 Encounters:  09/09/21 116/77         Signed Prescriptions Disp Refills   omeprazole (PRILOSEC) 40 MG capsule 90 capsule 1    Sig: TAKE 1 CAPSULE(40 MG) BY MOUTH DAILY     Gastroenterology: Proton Pump Inhibitors Passed - 09/29/2021  7:29 PM      Passed - Valid encounter within last 12 months    Recent Outpatient Visits           6 months ago Nonintractable chronic migraine   [place] Pickard, Cammie Mcgee, MD   7 months ago Nonintractable chronic migraine   [place] Pickard, Cammie Mcgee, MD   1 year ago General medical exam   [place] Susy Frizzle, MD   2 years ago Essential hypertension   [place], Warren T, MD   3 years ago Migraine with aura and without status migrainosus, not intractable   [place] Pickard, Cammie Mcgee, MD               metoprolol succinate (TOPROL-XL) 50 MG 24 hr tablet 30 tablet 0    Sig: TAKE 1 TABLET BY MOUTH EVERY DAY WITH OR FOLLOWING A MEAL     Cardiovascular:  Beta Blockers Failed - 09/29/2021  7:29 PM      Failed - Valid encounter within last 6 months    Recent Outpatient Visits           6 months ago Nonintractable chronic migraine   [place] Pickard, Cammie Mcgee, MD   7 months ago Nonintractable chronic migraine   [place] Pickard, Cammie Mcgee, MD   1 year ago General medical exam   [place] Susy Frizzle, MD   2 years ago Essential hypertension   [place], Warren T, MD   3 years ago Migraine with aura and without status migrainosus, not intractable   [place] Pickard, Cammie Mcgee, MD              Passed - Last BP in normal range    BP Readings from Last 1 Encounters:  09/09/21 116/77         Passed - Last Heart Rate in  normal range    Pulse Readings from Last 1 Encounters:  09/09/21 90          b

## 2021-09-30 NOTE — Telephone Encounter (Signed)
Requested Prescriptions  Pending Prescriptions Disp Refills  . omeprazole (PRILOSEC) 40 MG capsule [Pharmacy Med Name: OMEPRAZOLE 40MG CAPSULES] 90 capsule 1    Sig: TAKE 1 CAPSULE(40 MG) BY MOUTH DAILY     Gastroenterology: Proton Pump Inhibitors Passed - 09/29/2021  7:29 PM      Passed - Valid encounter within last 12 months    Recent Outpatient Visits          6 months ago Nonintractable chronic migraine   [place] Pickard, Cammie Mcgee, MD   7 months ago Nonintractable chronic migraine   [place] Pickard, Cammie Mcgee, MD   1 year ago General medical exam   [place] Susy Frizzle, MD   2 years ago Essential hypertension   [place], Warren T, MD   3 years ago Migraine with aura and without status migrainosus, not intractable   [place] Pickard, Cammie Mcgee, MD             . atorvastatin (LIPITOR) 40 MG tablet [Pharmacy Med Name: ATORVASTATIN 40MG TABLETS] 90 tablet 1    Sig: TAKE 1 TABLET(40 MG) BY MOUTH DAILY     Cardiovascular:  Antilipid - Statins Failed - 09/29/2021  7:29 PM      Failed - Lipid Panel in normal range within the last 12 months    Cholesterol, Total  Date Value Ref Range Status  01/20/2017 226 (H) 100 - 199 mg/dL Final   Cholesterol  Date Value Ref Range Status  03/21/2020 185 <200 mg/dL Final   LDL Cholesterol (Calc)  Date Value Ref Range Status  03/21/2020  mg/dL (calc) Final    Comment:    . LDL cholesterol not calculated. Triglyceride levels greater than 400 mg/dL invalidate calculated LDL results. . Reference range: <100 . Desirable range <100 mg/dL for primary prevention;   <70 mg/dL for patients with CHD or diabetic patients  with > or = 2 CHD risk factors. Marland Kitchen LDL-C is now calculated using the Maybee-Hopkins  calculation, which is a validated novel method providing  better accuracy than the Friedewald equation in the  estimation of LDL-C.   Cresenciano Genre et al. Annamaria Helling. 4163;845(36): 2061-2068  (http://education.[facility].com/faq/FAQ164)    HDL  Date Value Ref Range Status  03/21/2020 39 (L) > OR = 50 mg/dL Final  01/20/2017 42 >39 mg/dL Final   Triglycerides  Date Value Ref Range Status  03/21/2020 452 (H) <150 mg/dL Final    Comment:    . If a non-fasting specimen was collected, consider repeat triglyceride testing on a fasting specimen if clinically indicated.  Yates Decamp et al. J. of Clin. Lipidol. 4680;3:212-248. Marland Kitchen          Passed - Patient is not pregnant      Passed - Valid encounter within last 12 months    Recent Outpatient Visits          6 months ago Nonintractable chronic migraine   [place] Pickard, Cammie Mcgee, MD   7 months ago Nonintractable chronic migraine   [place] Pickard, Cammie Mcgee, MD   1 year ago General medical exam   [place] Susy Frizzle, MD   2 years ago Essential hypertension   [place], Warren T, MD   3 years ago Migraine with aura and without status migrainosus, not intractable   [place] Pickard, Cammie Mcgee, MD             .  celecoxib (CELEBREX) 200 MG capsule [Pharmacy Med Name: CELECOXIB 200MG CAPSULES] 180 capsule 1    Sig: TAKE 1 CAPSULE(200 MG) BY MOUTH TWICE DAILY     Analgesics:  COX2 Inhibitors Failed - 09/29/2021  7:29 PM      Failed - Manual Review: Labs are only required if the patient has taken medication for more than 8 weeks.      Failed - HGB in normal range and within 360 days    Hemoglobin  Date Value Ref Range Status  03/21/2020 13.3 11.7 - 15.5 g/dL Final  01/20/2017 11.6 11.1 - 15.9 g/dL Final         Failed - Cr in normal range and within 360 days    Creat  Date Value Ref Range Status  03/21/2020 1.25 (H) 0.50 - 1.10 mg/dL Final         Failed - HCT in normal range and within 360 days    HCT  Date Value Ref Range Status  03/21/2020  39.0 35.0 - 45.0 % Final   Hematocrit  Date Value Ref Range Status  01/20/2017 35.5 34.0 - 46.6 % Final         Failed - AST in normal range and within 360 days    AST  Date Value Ref Range Status  03/21/2020 14 10 - 35 U/L Final         Failed - ALT in normal range and within 360 days    ALT  Date Value Ref Range Status  03/21/2020 12 6 - 29 U/L Final         Failed - eGFR is 30 or above and within 360 days    GFR, Est African American  Date Value Ref Range Status  03/21/2020 60 > OR = 60 mL/min/1.71m Final   GFR, Est Non African American  Date Value Ref Range Status  03/21/2020 52 (L) > OR = 60 mL/min/1.767mFinal         Passed - Patient is not pregnant      Passed - Valid encounter within last 12 months    Recent Outpatient Visits          6 months ago Nonintractable chronic migraine   [place], Wa[place]ammie McgeeMD   7 months ago Nonintractable chronic migraine   [place], WaCammie McgeeMD   1 year ago General medical exam   [facility] [facility] [place] FrizzleMD   2 years ago Essential hypertension   [place] T, MD   3 years ago Migraine with aura and without status migrainosus, not intractable   [place]ard, WaCammie McgeeMD             . metoprolol succinate (TOPROL-XL) 50 MG 24 hr tablet [Pharmacy Med Name: METOPROLOL ER SUCCINATE 50MG TABS] 30 tablet 0    Sig: TAKE 1 TABLET BY MOUTH EVERY DAY WITH OR FOLLOWING A MEAL     Cardiovascular:  Beta Blockers Failed - 09/29/2021  7:29 PM      Failed - Valid encounter within last 6 months    Recent Outpatient Visits          6 months ago Nonintractable chronic migraine   [place] McgeeMD   7 months ago Nonintractable chronic migraine   [place]   1 year ago General medical exam  [place] Pickard,  Cammie Mcgee, MD   2 years ago Essential hypertension   [place], Cammie Mcgee, MD   3 years ago Migraine with aura and without status migrainosus, not intractable   [place] Pickard, Cammie Mcgee, MD             Passed - Last BP in normal range    BP Readings from Last 1 Encounters:  09/09/21 116/77         Passed - Last Heart Rate in normal range    Pulse Readings from Last 1 Encounters:  09/09/21 90         . losartan (COZAAR) 100 MG tablet [Pharmacy Med Name: LOSARTAN 100MG TABLETS] 90 tablet 1    Sig: TAKE 1 TABLET BY MOUTH DAILY     Cardiovascular:  Angiotensin Receptor Blockers Failed - 09/29/2021  7:29 PM      Failed - Cr in normal range and within 180 days    Creat  Date Value Ref Range Status  03/21/2020 1.25 (H) 0.50 - 1.10 mg/dL Final         Failed - K in normal range and within 180 days    Potassium  Date Value Ref Range Status  03/21/2020 3.9 3.5 - 5.3 mmol/L Final         Failed - Valid encounter within last 6 months    Recent Outpatient Visits          6 months ago Nonintractable chronic migraine   [place] Dennard [place], Cammie Mcgee, MD   7 months ago Nonintractable chronic migraine   [place] Pickard, Cammie Mcgee, MD   1 year ago General medical exam   [place] Susy Frizzle, MD   2 years ago Essential hypertension   [place], Warren T, MD   3 years ago Migraine with aura and without status migrainosus, not intractable   [place] Pickard, Cammie Mcgee, MD             Passed - Patient is not pregnant      Passed - Last BP in normal range    BP Readings from Last 1 Encounters:  09/09/21 116/77

## 2021-09-30 NOTE — Telephone Encounter (Signed)
Duplicate request- all Rx filled today- needs to keep appointment Requested Prescriptions  Pending Prescriptions Disp Refills  . topiramate (TOPAMAX) 50 MG tablet [Pharmacy Med Name: TOPIRAMATE 50MG  TABLETS] 180 tablet     Sig: TAKE 1 TABLET(50 MG) BY MOUTH TWICE DAILY     Neurology: Anticonvulsants - topiramate & zonisamide Failed - 09/30/2021 10:14 AM      Failed - Cr in normal range and within 360 days    Creat  Date Value Ref Range Status  03/21/2020 1.25 (H) 0.50 - 1.10 mg/dL Final         Failed - CO2 in normal range and within 360 days    CO2  Date Value Ref Range Status  03/21/2020 21 20 - 32 mmol/L Final         Failed - ALT in normal range and within 360 days    ALT  Date Value Ref Range Status  03/21/2020 12 6 - 29 U/L Final         Failed - AST in normal range and within 360 days    AST  Date Value Ref Range Status  03/21/2020 14 10 - 35 U/L Final         Failed - Completed PHQ-2 or PHQ-9 in the last 360 days      Passed - Valid encounter within last 12 months    Recent Outpatient Visits          6 months ago Nonintractable chronic migraine   [facility], Tanya Nones, MD   7 months ago Nonintractable chronic migraine   [facility] [place], [employer], MD   1 year ago General medical exam   [facility], MD   2 years ago Essential hypertension   [facility], MD   3 years ago Migraine with aura and without status migrainosus, not intractable   [facility] [place], [facility], MD      Future Appointments            In 1 month [place], Priscille [facility]idelberg, MD [facility], [facility]           . metoprolol succinate (TOPROL-XL) 50 MG 24 hr tablet [Pharmacy Med Name: METOPROLOL ER SUCCINATE 50MG  TABS] 90 tablet     Sig: TAKE 1 TABLET BY MOUTH EVERY DAY WITH OR FOLLOWING A MEAL. PT NEEDS TO BE SEEN     Cardiovascular:  Beta Blockers  Failed - 09/30/2021 10:14 AM      Failed - Valid encounter within last 6 months    Recent Outpatient Visits          6 months ago Nonintractable chronic migraine   [facility] [place], 11/30/2021, MD   7 months ago Nonintractable chronic migraine   [facility] [place], Priscille [facility]idelberg, MD   1 year ago General medical exam   [facility] Priscille Heidelberg, MD   2 years ago Essential hypertension   [facility] Donita Brooks, MD   3 years ago Migraine with aura and without status migrainosus, not intractable   [facility] [place], Donita Brooks, MD      Future Appointments            In 1 month [place], [facility], MD [facility]           Passed -  Last BP in normal range    BP Readings from Last 1 Encounters:  09/09/21 116/77         Passed - Last Heart Rate in normal range    Pulse Readings from Last 1 Encounters:  09/09/21 90         . topiramate (TOPAMAX) 50 MG tablet [Pharmacy Med Name: TOPIRAMATE 50MG  TABLETS] 180 tablet     Sig: TAKE 1 TABLET(50 MG) BY MOUTH TWICE DAILY     Neurology: Anticonvulsants - topiramate & zonisamide Failed - 09/30/2021 10:14 AM      Failed - Cr in normal range and within 360 days    Creat  Date Value Ref Range Status  03/21/2020 1.25 (H) 0.50 - 1.10 mg/dL Final         Failed - CO2 in normal range and within 360 days    CO2  Date Value Ref Range Status  03/21/2020 21 20 - 32 mmol/L Final         Failed - ALT in normal range and within 360 days    ALT  Date Value Ref Range Status  03/21/2020 12 6 - 29 U/L Final         Failed - AST in normal range and within 360 days    AST  Date Value Ref Range Status  03/21/2020 14 10 - 35 U/L Final         Failed - Completed PHQ-2 or PHQ-9 in the last 360 days      Passed - Valid encounter within last 12 months    Recent Outpatient Visits          6 months ago Nonintractable chronic migraine    [facility], Tanya Nones, MD   7 months ago Nonintractable chronic migraine   [facility] [place], [facility], MD   1 year ago General medical exam   [facility], MD   2 years ago Essential hypertension   [facility], MD   3 years ago Migraine with aura and without status migrainosus, not intractable   [facility] [place], [facility], MD      Future Appointments            In 1 month [place], Priscille Heidelberg, MD [facility], [facility]

## 2021-10-30 ENCOUNTER — Encounter: Payer: Federal, State, Local not specified - PPO | Admitting: Family Medicine

## 2021-11-03 ENCOUNTER — Encounter: Payer: Federal, State, Local not specified - PPO | Admitting: Family Medicine

## 2021-11-06 ENCOUNTER — Ambulatory Visit: Payer: Federal, State, Local not specified - PPO | Admitting: Family Medicine

## 2021-11-06 VITALS — BP 120/72 | HR 91 | Temp 98.6°F | Ht 61.0 in | Wt 141.0 lb

## 2021-11-06 DIAGNOSIS — F321 Major depressive disorder, single episode, moderate: Secondary | ICD-10-CM | POA: Diagnosis not present

## 2021-11-06 DIAGNOSIS — Z01419 Encounter for gynecological examination (general) (routine) without abnormal findings: Secondary | ICD-10-CM | POA: Diagnosis not present

## 2021-11-06 DIAGNOSIS — I10 Essential (primary) hypertension: Secondary | ICD-10-CM

## 2021-11-06 MED ORDER — DULOXETINE HCL 30 MG PO CPEP: 30.0000 mg | ORAL_CAPSULE | Freq: Every day | ORAL | 0 refills | Status: DC

## 2021-11-06 MED ORDER — ONDANSETRON HCL 4 MG PO TABS: 4.0000 mg | ORAL_TABLET | Freq: Three times a day (TID) | ORAL | 0 refills | Status: DC | PRN

## 2021-11-06 MED ORDER — BUPROPION HCL ER (XL) 150 MG PO TB24: 150.0000 mg | ORAL_TABLET | Freq: Every day | ORAL | 3 refills | Status: DC
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
## 2021-11-07 LAB — COMPLETE METABOLIC PANEL WITH GFR
AG Ratio: 1.7 (calc) (ref 1.0–2.5)
ALT: 20 U/L (ref 6–29)
AST: 13 U/L (ref 10–35)
Albumin: 4.2 g/dL (ref 3.6–5.1)
Alkaline phosphatase (APISO): 75 U/L (ref 31–125)
BUN/Creatinine Ratio: 12 (calc) (ref 6–22)
BUN: 16 mg/dL (ref 7–25)
CO2: 19 mmol/L — ABNORMAL LOW (ref 20–32)
Calcium: 9.4 mg/dL (ref 8.6–10.2)
Chloride: 110 mmol/L (ref 98–110)
Creat: 1.3 mg/dL — ABNORMAL HIGH (ref 0.50–0.99)
Globulin: 2.5 g/dL (calc) (ref 1.9–3.7)
Glucose, Bld: 94 mg/dL (ref 65–99)
Potassium: 5.5 mmol/L — ABNORMAL HIGH (ref 3.5–5.3)
Sodium: 138 mmol/L (ref 135–146)
Total Bilirubin: 0.3 mg/dL (ref 0.2–1.2)
Total Protein: 6.7 g/dL (ref 6.1–8.1)
eGFR: 51 mL/min/{1.73_m2} — ABNORMAL LOW (ref >=60)

## 2021-11-07 LAB — CBC WITH DIFFERENTIAL/PLATELET
Absolute Monocytes: 616 cells/uL (ref 200–950)
Basophils Absolute: 40 cells/uL (ref 0–200)
Basophils Relative: 0.5 %
Eosinophils Absolute: 48 cells/uL (ref 15–500)
Eosinophils Relative: 0.6 %
HCT: 39.7 % (ref 35.0–45.0)
Hemoglobin: 13.5 g/dL (ref 11.7–15.5)
Lymphs Abs: 2368 cells/uL (ref 850–3900)
MCH: 32.5 pg (ref 27.0–33.0)
MCHC: 34 g/dL (ref 32.0–36.0)
MCV: 95.4 fL (ref 80.0–100.0)
MPV: 10.6 fL (ref 7.5–12.5)
Monocytes Relative: 7.7 %
Neutro Abs: 4928 cells/uL (ref 1500–7800)
Neutrophils Relative %: 61.6 %
Platelets: 354 10*3/uL (ref 140–400)
RBC: 4.16 10*6/uL (ref 3.80–5.10)
RDW: 12.2 % (ref 11.0–15.0)
Total Lymphocyte: 29.6 %
WBC: 8 10*3/uL (ref 3.8–10.8)

## 2021-11-07 LAB — LIPID PANEL
Cholesterol: 138 mg/dL (ref <200)
HDL: 44 mg/dL — ABNORMAL LOW (ref >=50)
LDL Cholesterol (Calc): 69 mg/dL (calc)
Non-HDL Cholesterol (Calc): 94 mg/dL (calc) (ref <130)
Total CHOL/HDL Ratio: 3.1 (calc) (ref <5.0)
Triglycerides: 167 mg/dL — ABNORMAL HIGH (ref <150)

## 2021-11-09 ENCOUNTER — Other Ambulatory Visit: Payer: Self-pay

## 2021-11-09 DIAGNOSIS — R944 Abnormal results of kidney function studies: Secondary | ICD-10-CM

## 2021-11-13 ENCOUNTER — Encounter: Payer: Self-pay | Admitting: Neurology

## 2021-11-16 ENCOUNTER — Telehealth: Payer: Self-pay

## 2021-11-16 NOTE — Telephone Encounter (Signed)
PA needed for Botox 200 units

## 2021-11-18 ENCOUNTER — Ambulatory Visit (HOSPITAL_COMMUNITY)
Admission: RE | Admit: 2021-11-18 | Discharge: 2021-11-18 | Disposition: A | Discharge status: Home / Self Care | Payer: Federal, State, Local not specified - PPO | Source: Ambulatory Visit | Attending: Family Medicine | Admitting: Family Medicine

## 2021-11-18 ENCOUNTER — Telehealth: Payer: Self-pay

## 2021-11-18 DIAGNOSIS — R944 Abnormal results of kidney function studies: Secondary | ICD-10-CM | POA: Diagnosis not present

## 2021-11-18 NOTE — Telephone Encounter (Signed)
Noted, will begin BIV in separate encounter.

## 2021-11-18 NOTE — Telephone Encounter (Signed)
Received New Start request for Botox 200.   Added pt into BotoxOne portal and submitted request for BIV initiation ([id number]). Will await f/u.   Pt enrolled into Botox Savings program, copy of card will be sent to scan center for retention.

## 2021-11-24 ENCOUNTER — Encounter: Payer: Self-pay | Admitting: Obstetrics & Gynecology

## 2021-11-24 DIAGNOSIS — Z0289 Encounter for other administrative examinations: Secondary | ICD-10-CM

## 2021-11-25 ENCOUNTER — Other Ambulatory Visit (HOSPITAL_COMMUNITY): Payer: Self-pay

## 2021-11-27 ENCOUNTER — Other Ambulatory Visit: Payer: Federal, State, Local not specified - PPO

## 2021-11-30 NOTE — Telephone Encounter (Signed)
   Per BIV results, BOTOX is excluded from pt's Pharmacy benefits and is not eligible to have a PA submitted in order to obtain coverage. Medication will need to be obtained via [facility]. See below for coverage information.    Routing for f/u.

## 2021-12-01 ENCOUNTER — Encounter: Payer: Self-pay | Admitting: Neurology

## 2021-12-01 NOTE — Telephone Encounter (Signed)
LMOVM for patient she maybe added to the Botox day for this month.   Front desk please add patient to the 10/20 Botox day when patient calls back.

## 2021-12-07 ENCOUNTER — Encounter: Payer: Federal, State, Local not specified - PPO | Admitting: Family Medicine

## 2021-12-10 ENCOUNTER — Other Ambulatory Visit: Payer: Federal, State, Local not specified - PPO

## 2021-12-11 ENCOUNTER — Other Ambulatory Visit (HOSPITAL_COMMUNITY): Payer: Self-pay

## 2021-12-11 ENCOUNTER — Ambulatory Visit: Payer: Federal, State, Local not specified - PPO | Admitting: Neurology

## 2021-12-11 DIAGNOSIS — G43719 Chronic migraine without aura, intractable, without status migrainosus: Secondary | ICD-10-CM

## 2021-12-11 MED ORDER — ONABOTULINUMTOXINA 100 UNITS IJ SOLR
200.0000 [IU] | Freq: Once | INTRAMUSCULAR | Status: AC
  Administered 2021-12-11: 155 [IU] via INTRAMUSCULAR

## 2021-12-11 NOTE — Addendum Note (Signed)
Addended by: Venetia Night on: 12/11/2021 04:01 PM   Modules accepted: Orders

## 2021-12-11 NOTE — Progress Notes (Signed)
Botulinum Clinic  ° °Procedure Note Botox ° °Attending: Dr. Jamus Loving ° °Preoperative Diagnosis(es): Chronic migraine ° °Consent obtained from: The patient °Benefits discussed included, but were not limited to decreased muscle tightness, increased joint range of motion, and decreased pain.  Risk discussed included, but were not limited pain and discomfort, bleeding, bruising, excessive weakness, venous thrombosis, muscle atrophy and dysphagia.  Anticipated outcomes of the procedure as well as he risks and benefits of the alternatives to the procedure, and the roles and tasks of the personnel to be involved, were discussed with the patient, and the patient consents to the procedure and agrees to proceed. A copy of the patient medication guide was given to the patient which explains the blackbox warning. ° °Patients identity and treatment sites confirmed Yes.  . ° °Details of Procedure: °Skin was cleaned with alcohol. Prior to injection, the needle plunger was aspirated to make sure the needle was not within a blood vessel.  There was no blood retrieved on aspiration.   ° °Following is a summary of the muscles injected  And the amount of Botulinum toxin used: ° °Dilution °200 units of Botox was reconstituted with 4 ml of preservative free normal saline. °Time of reconstitution: At the time of the office visit (<30 minutes prior to injection)  ° °Injections  °155 total units of Botox was injected with a 30 gauge needle. ° °Injection Sites: °L occipitalis: 15 units- 3 sites  °R occiptalis: 15 units- 3 sites ° °L upper trapezius: 15 units- 3 sites °R upper trapezius: 15 units- 3 sits          °L paraspinal: 10 units- 2 sites °R paraspinal: 10 units- 2 sites ° °Face °L frontalis(2 injection sites):10 units   °R frontalis(2 injection sites):10 units         °L corrugator: 5 units   °R corrugator: 5 units           °Procerus: 5 units   °L temporalis: 20 units °R temporalis: 20 units  ° °Agent:  °200 units of botulinum Type  A (Onobotulinum Toxin type A) was reconstituted with 4 ml of preservative free normal saline.  °Time of reconstitution: At the time of the office visit (<30 minutes prior to injection)  ° ° ° Total injected (Units):  155 ° Total wasted (Units):  45 ° °Patient tolerated procedure well without complications.   °Reinjection is anticipated in 3 months. ° ° °

## 2021-12-14 ENCOUNTER — Other Ambulatory Visit: Payer: Self-pay | Admitting: Family Medicine

## 2022-01-07 ENCOUNTER — Other Ambulatory Visit: Payer: Self-pay | Admitting: Family Medicine

## 2022-01-07 NOTE — Telephone Encounter (Signed)
Requested Prescriptions  Pending Prescriptions Disp Refills   losartan (COZAAR) 100 MG tablet [Pharmacy Med Name: LOSARTAN 100MG  TABLETS] 90 tablet 1    Sig: TAKE 1 TABLET BY MOUTH DAILY     Cardiovascular:  Angiotensin Receptor Blockers Failed - 01/07/2022  4:10 PM      Failed - Cr in normal range and within 180 days    Creat  Date Value Ref Range Status  11/06/2021 1.30 (H) 0.50 - 0.99 mg/dL Final         Failed - K in normal range and within 180 days    Potassium  Date Value Ref Range Status  11/06/2021 5.5 (H) 3.5 - 5.3 mmol/L Final         Failed - Valid encounter within last 6 months    Recent Outpatient Visits           10 months ago Nonintractable chronic migraine   [facility] [place], [facility], MD   10 months ago Nonintractable chronic migraine   [facility] [place], [employer], MD   1 year ago General medical exam   [facility], MD   3 years ago Essential hypertension   [facility], MD   3 years ago Migraine with aura and without status migrainosus, not intractable   [facility] [place], [facility], MD              Passed - Patient is not pregnant      Passed - Last BP in normal range    BP Readings from Last 1 Encounters:  11/06/21 120/72

## 2022-01-12 IMAGING — DX DG ANKLE COMPLETE 3+V*L*
3 series · 3 of 3 positions shown · non-contrast
Comparison: 12/21/2017

CLINICAL DATA: Chronic ankle pain since a fracture in 8302.

EXAM:
LEFT ANKLE COMPLETE - 3+ VIEW

[ankle ap]
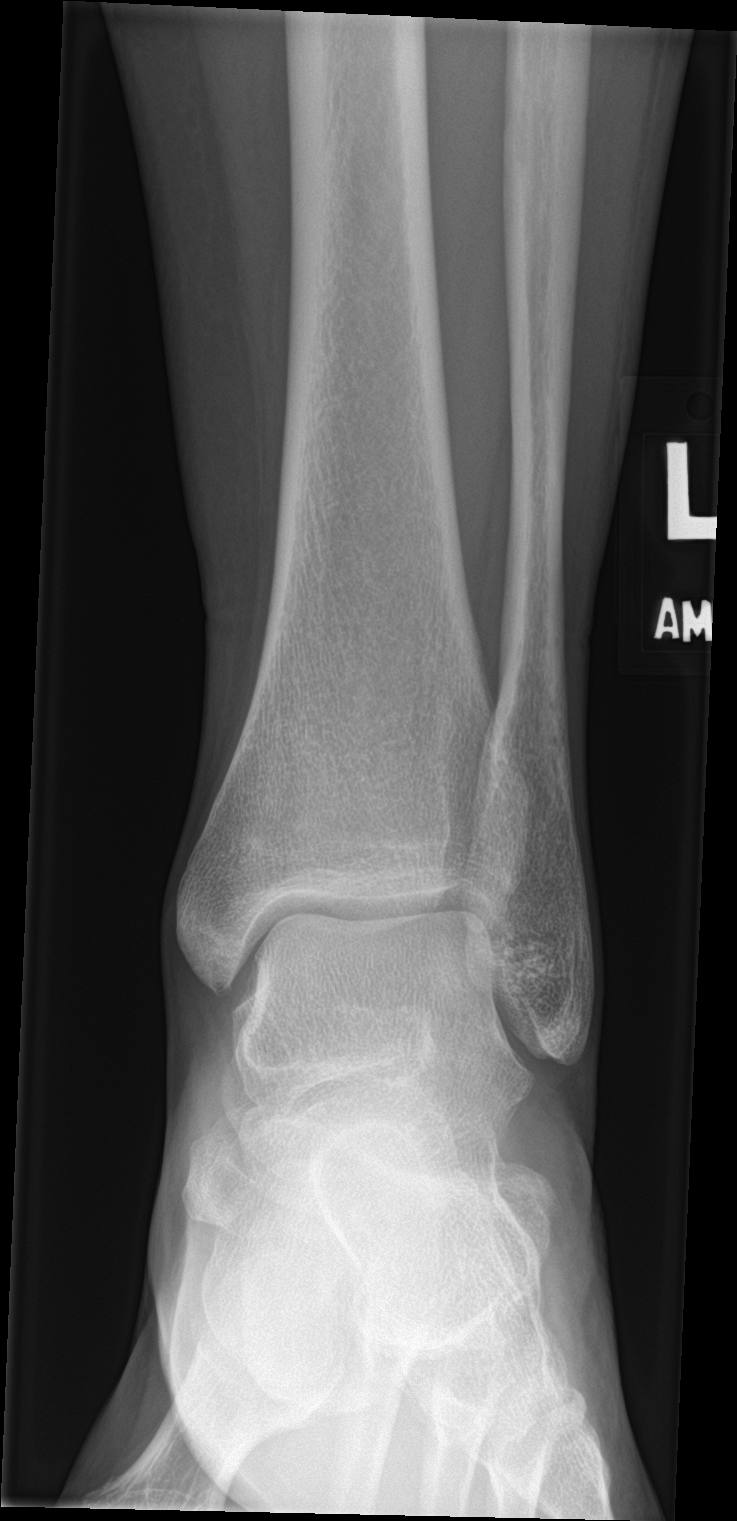

[ankle obl]
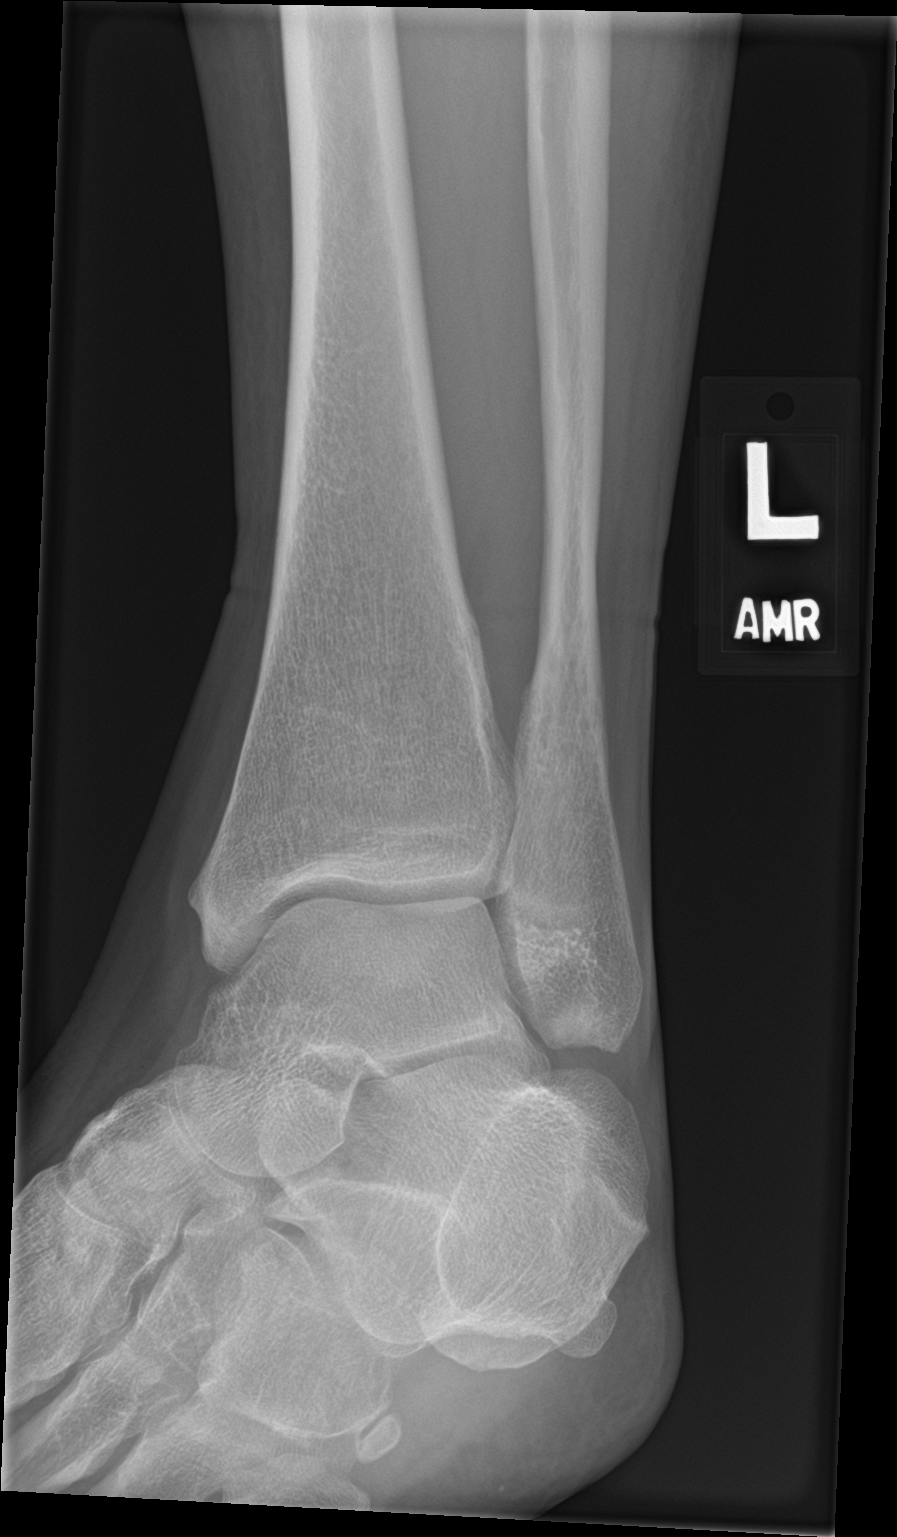

[ankle lat]
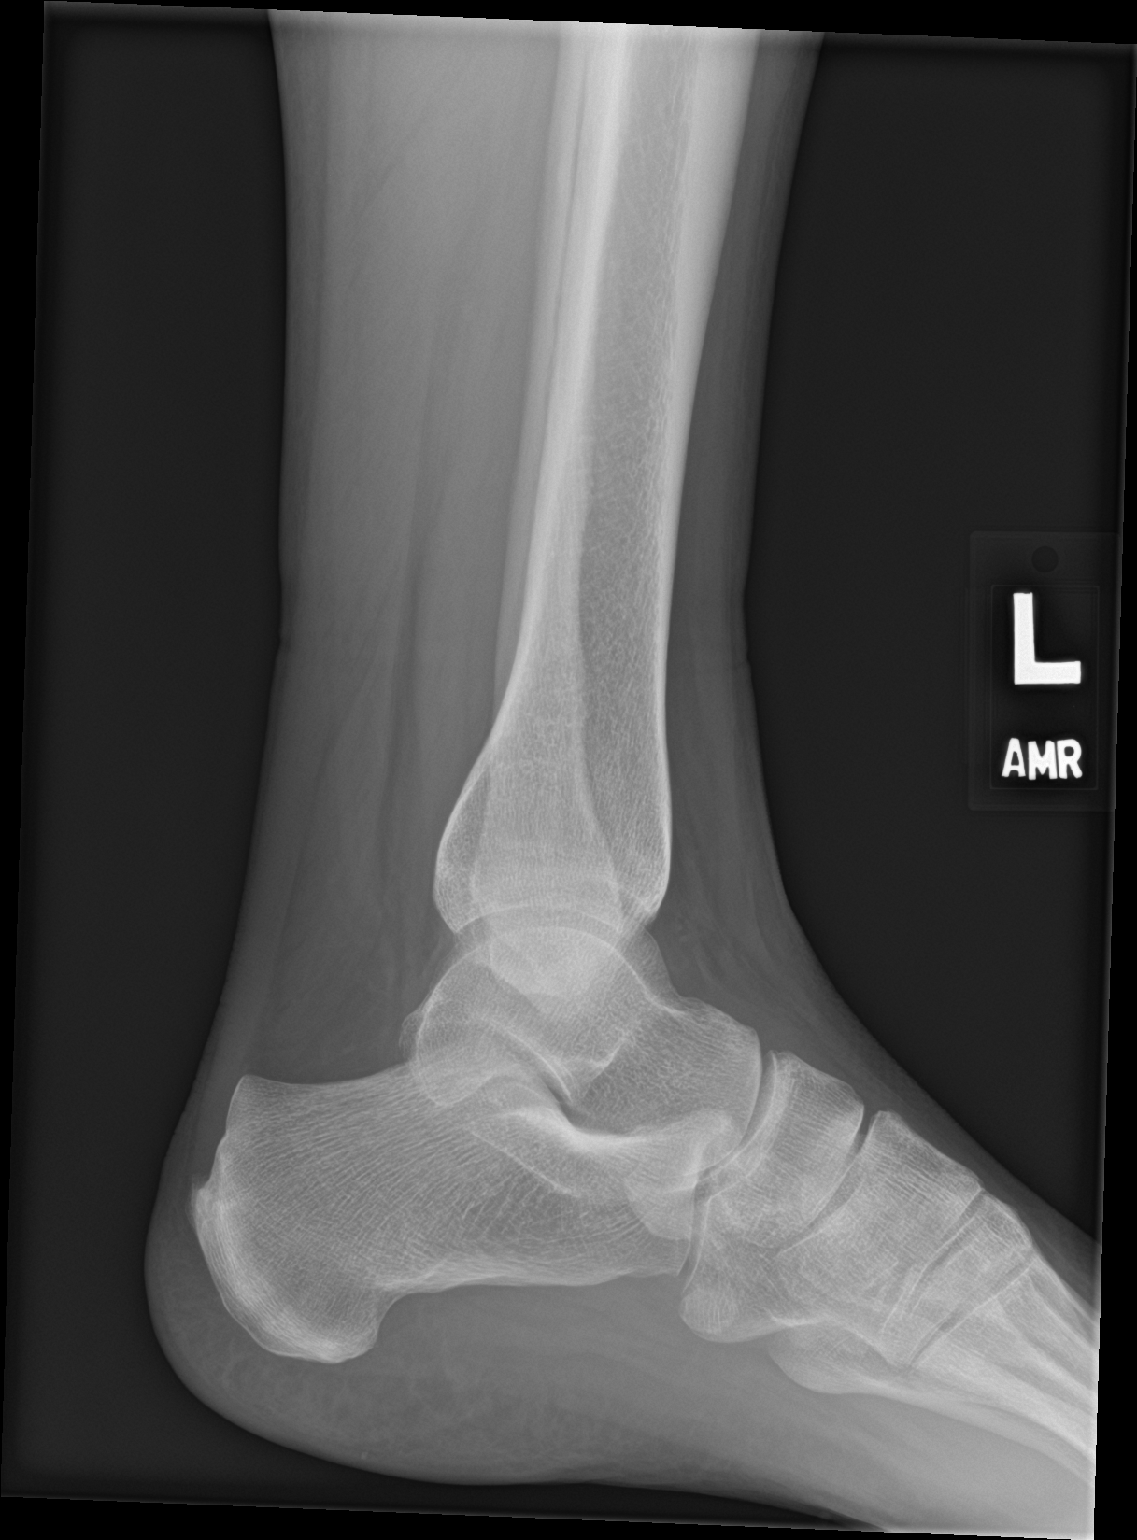

[3 of 3 positions shown; findings below may reference images not displayed]

FINDINGS: The subtle avulsion fracture from the anterolateral process of the
calcaneus noted on the prior exam is no longer visualized.

There are no visible fractures, and no bone lesions.

The ankle joint is normally spaced and aligned. No arthropathic
changes.

Normal soft tissues.
IMPRESSION: Negative.

## 2022-02-06 ENCOUNTER — Other Ambulatory Visit: Payer: Self-pay | Admitting: Family Medicine

## 2022-02-08 ENCOUNTER — Other Ambulatory Visit: Payer: Self-pay | Admitting: Family Medicine

## 2022-02-18 ENCOUNTER — Other Ambulatory Visit (HOSPITAL_COMMUNITY): Payer: Self-pay

## 2022-02-18 ENCOUNTER — Telehealth: Payer: Self-pay

## 2022-02-18 NOTE — Telephone Encounter (Signed)
PA request received via patients pharmacy through fax for Aimovig 140MG /ML auto-injectors.  PA has been submitted to [employer] via CMM and is awaiting determination.  Key: 

## 2022-02-19 ENCOUNTER — Other Ambulatory Visit (HOSPITAL_COMMUNITY): Payer: Self-pay

## 2022-02-19 NOTE — Telephone Encounter (Signed)
Patient Advocate Encounter  Prior Authorization for AIMOVIG 140MG  has been approved.    PA# Effective dates: 11.28.23 through 12.27.24  Pt pharmacy [facility]) has been contacted and has processed as a $60 copay. Will be ordered for Tuesday 1.2.24.

## 2022-03-04 NOTE — Telephone Encounter (Signed)
Botox one results says plan prefers Buy/Bill

## 2022-03-05 ENCOUNTER — Emergency Department (HOSPITAL_COMMUNITY)
Admission: EM | Admit: 2022-03-05 | Discharge: 2022-03-05 | Disposition: A | Discharge status: Home / Self Care | Payer: Federal, State, Local not specified - PPO | Attending: Emergency Medicine | Admitting: Emergency Medicine

## 2022-03-05 ENCOUNTER — Encounter (HOSPITAL_COMMUNITY): Payer: Self-pay | Admitting: Emergency Medicine

## 2022-03-05 ENCOUNTER — Other Ambulatory Visit: Payer: Self-pay

## 2022-03-05 DIAGNOSIS — G43711 Chronic migraine without aura, intractable, with status migrainosus: Secondary | ICD-10-CM | POA: Diagnosis not present

## 2022-03-05 DIAGNOSIS — R42 Dizziness and giddiness: Secondary | ICD-10-CM | POA: Diagnosis not present

## 2022-03-05 DIAGNOSIS — G43709 Chronic migraine without aura, not intractable, without status migrainosus: Secondary | ICD-10-CM | POA: Diagnosis not present

## 2022-03-05 MED ORDER — MECLIZINE HCL 12.5 MG PO TABS
25.0000 mg | ORAL_TABLET | Freq: Once | ORAL | Status: AC
  Administered 2022-03-05: 25 mg via ORAL
  Filled 2022-03-05: qty 2

## 2022-03-05 MED ORDER — DIAZEPAM 5 MG/ML IJ SOLN
5.0000 mg | Freq: Once | INTRAMUSCULAR | Status: AC
  Administered 2022-03-05: 5 mg via INTRAVENOUS
  Filled 2022-03-05: qty 2

## 2022-03-05 MED ORDER — SODIUM CHLORIDE 0.9 % IV BOLUS
500.0000 mL | Freq: Once | INTRAVENOUS | Status: AC
  Administered 2022-03-05: 500 mL via INTRAVENOUS

## 2022-03-05 MED ORDER — DEXAMETHASONE SODIUM PHOSPHATE 10 MG/ML IJ SOLN
10.0000 mg | Freq: Once | INTRAMUSCULAR | Status: AC
  Administered 2022-03-05: 10 mg via INTRAMUSCULAR
  Filled 2022-03-05: qty 1

## 2022-03-05 NOTE — ED Notes (Signed)
ED Provider at bedside. 

## 2022-03-05 NOTE — ED Triage Notes (Addendum)
C/o of vertigo since yesterday. Every time pt yawns, states "it feels like fluid my ears". C/o nausea. Pt also c/o of migraine and has chronic migraines for the same. Pt states fine when sitting up straight or lying down. Sudden movements cause pt to fall.

## 2022-03-05 NOTE — ED Provider Notes (Signed)
[facility] EMERGENCY DEPARTMENT Provider Note   CSN: [id number] Arrival date & time: 03/05/22  0043     History  Chief Complaint  Patient presents with   Dizziness    BRENDALEE MATTHIES is a 47 y.o. female.  Patient presents to the emerged department for evaluation of concern over blood pressure.  Patient reports a history of chronic migraine and migraine induced vertigo.  The symptoms have been ongoing for more than a year.  Patient reports that tonight the vertigo worsened and she was having trouble walking.  Sudden movements of her head make her vertigo symptoms worse.  Patient checked her blood pressure at home and it was elevated which prompted her to be concerned enough to come to the ER.       Home Medications Prior to Admission medications   Medication Sig Start Date End Date Taking? Authorizing Provider  amitriptyline (ELAVIL) 50 MG tablet TAKE 1 TABLET(50 MG) BY MOUTH AT BEDTIME 04/01/21   Susy Frizzle, MD  Ashwagandha 120 MG CAPS Take by mouth.    [provider]  atorvastatin (LIPITOR) 40 MG tablet TAKE 1 TABLET(40 MG) BY MOUTH DAILY 09/30/21   Susy Frizzle, MD  buPROPion (WELLBUTRIN XL) 150 MG 24 hr tablet Take 1 tablet (150 mg total) by mouth daily. 11/06/21   Susy Frizzle, MD  celecoxib (CELEBREX) 200 MG capsule TAKE 1 CAPSULE(200 MG) BY MOUTH TWICE DAILY 09/30/21   Susy Frizzle, MD  cyclobenzaprine (FLEXERIL) 10 MG tablet TAKE 1 TABLET(10 MG) BY MOUTH THREE TIMES DAILY AS NEEDED FOR HEADACHE 04/02/21   Susy Frizzle, MD  cyclobenzaprine (FLEXERIL) 10 MG tablet TAKE 1 TABLET(10 MG) BY MOUTH THREE TIMES DAILY AS NEEDED FOR HEADACHE 04/02/21   Susy Frizzle, MD  DULoxetine (CYMBALTA) 30 MG capsule TAKE 1 CAPSULE(30 MG) BY MOUTH DAILY 12/14/21   Susy Frizzle, MD  Erenumab-aooe (AIMOVIG) 140 MG/ML SOAJ Inject 140 mg into the skin every 28 (twenty-eight) days. 06/12/21   Tomi Likens, Adam R, DO  hydrochlorothiazide (HYDRODIURIL) 25 MG tablet TAKE 1  TABLET(25 MG) BY MOUTH DAILY 08/28/21   Susy Frizzle, MD  losartan (COZAAR) 100 MG tablet TAKE 1 TABLET BY MOUTH DAILY 01/07/22   Susy Frizzle, MD  metoprolol succinate (TOPROL-XL) 50 MG 24 hr tablet TAKE 1 TABLET BY MOUTH EVERY DAY WITH OR FOLLOWING A MEAL 09/30/21   Susy Frizzle, MD  Omega-3 Fatty Acids (OMEGA-3 FISH OIL) 300 MG CAPS Take by mouth.    [provider]  omeprazole (PRILOSEC) 40 MG capsule TAKE 1 CAPSULE(40 MG) BY MOUTH DAILY 02/08/22   Susy Frizzle, MD  ondansetron (ZOFRAN) 4 MG tablet Take 1 tablet (4 mg total) by mouth every 8 (eight) hours as needed for nausea or vomiting. 11/06/21   Susy Frizzle, MD  rizatriptan (MAXALT-MLT) 10 MG disintegrating tablet Take 1 tablet (10 mg total) by mouth as needed for migraine. May repeat in 2 hours if needed.  Maximum 2 tablets in 24 hours. 09/09/21   Pieter Partridge, DO  SUMAtriptan (IMITREX) 20 MG/ACT nasal spray Spray once in one nostril.  May repeat in 2 hours if headache persists or recurs.  Maximum 2 sprays in 24 hours. 06/15/21   Tomi Likens, Adam R, DO  topiramate (TOPAMAX) 50 MG tablet TAKE 1 TABLET(50 MG) BY MOUTH TWICE DAILY 02/08/22   Susy Frizzle, MD      Allergies    Codeine    Review of Systems  Review of Systems  Physical Exam Updated Vital Signs BP 139/84   Pulse 82   Temp 98.6 F (37 C) (Oral)   Resp 18   Ht 5\' 1"  (1.549 m)   Wt 65.8 kg   LMP 03/18/2017   SpO2 98%   BMI 27.40 kg/m  Physical Exam Vitals and nursing note reviewed.  Constitutional:      General: She is not in acute distress.    Appearance: She is well-developed.  HENT:     Head: Normocephalic and atraumatic.     Right Ear: Tympanic membrane normal.     Left Ear: Tympanic membrane normal.     Mouth/Throat:     Mouth: Mucous membranes are moist.  Eyes:     General: Vision grossly intact. Gaze aligned appropriately.     Extraocular Movements: Extraocular movements intact.     Conjunctiva/sclera: Conjunctivae  normal.  Cardiovascular:     Rate and Rhythm: Normal rate and regular rhythm.     Pulses: Normal pulses.     Heart sounds: Normal heart sounds, S1 normal and S2 normal. No murmur heard.    No friction rub. No gallop.  Pulmonary:     Effort: Pulmonary effort is normal. No respiratory distress.     Breath sounds: Normal breath sounds.  Abdominal:     General: Bowel sounds are normal.     Palpations: Abdomen is soft.     Tenderness: There is no abdominal tenderness. There is no guarding or rebound.     Hernia: No hernia is present.  Musculoskeletal:        General: No swelling.     Cervical back: Full passive range of motion without pain, normal range of motion and neck supple. No spinous process tenderness or muscular tenderness. Normal range of motion.     Right lower leg: No edema.     Left lower leg: No edema.  Skin:    General: Skin is warm and dry.     Capillary Refill: Capillary refill takes less than 2 seconds.     Findings: No ecchymosis, erythema, rash or wound.  Neurological:     General: No focal deficit present.     Mental Status: She is alert and oriented to person, place, and time.     GCS: GCS eye subscore is 4. GCS verbal subscore is 5. GCS motor subscore is 6.     Cranial Nerves: Cranial nerves 2-12 are intact.     Sensory: Sensation is intact.     Motor: Motor function is intact.     Coordination: Coordination is intact.  Psychiatric:        Attention and Perception: Attention normal.        Mood and Affect: Mood normal.        Speech: Speech normal.        Behavior: Behavior normal.     ED Results / Procedures / Treatments   Labs (all labs ordered are listed, but only abnormal results are displayed) Labs Reviewed - No data to display  EKG None  Radiology No results found.  Procedures Procedures    Medications Ordered in ED Medications  sodium chloride 0.9 % bolus 500 mL (0 mLs Intravenous Stopped 03/05/22 0239)  dexamethasone (DECADRON)  injection 10 mg (10 mg Intramuscular Given 03/05/22 0148)  diazepam (VALIUM) injection 5 mg (5 mg Intravenous Given 03/05/22 0147)  meclizine (ANTIVERT) tablet 25 mg (25 mg Oral Given 03/05/22 0154)    ED Course/ Medical Decision Making/  A&P                           Medical Decision Making Risk Prescription drug management.   Presents primarily with concerns over elevated blood pressure.  She has secondary complaints of migraine headache and vertigo, but these are chronic problems, going on for more than a year.  She has a neurologist that has essentially told her there is not any more treatments available.  She has a normal neurologic exam.  Patient administered IV fluids, Decadron, Valium and her blood pressure is improved.  She still has some dizziness with moving her head but, again, this is chronic.  She will be discharged and is to follow-up with her neurologist.        Final Clinical Impression(s) / ED Diagnoses Final diagnoses:  Intractable chronic migraine without aura and with status migrainosus  Vertigo    Rx / DC Orders ED Discharge Orders     None         Minna Dumire, Gwenyth Allegra, MD 03/05/22 [phone]

## 2022-03-12 ENCOUNTER — Ambulatory Visit: Payer: Federal, State, Local not specified - PPO | Admitting: Neurology

## 2022-03-12 ENCOUNTER — Encounter: Payer: Self-pay | Admitting: Neurology

## 2022-03-12 DIAGNOSIS — Z029 Encounter for administrative examinations, unspecified: Secondary | ICD-10-CM

## 2022-03-18 ENCOUNTER — Other Ambulatory Visit: Payer: Self-pay | Admitting: Family Medicine

## 2022-03-18 ENCOUNTER — Encounter: Payer: Self-pay | Admitting: Family Medicine

## 2022-03-18 MED ORDER — MECLIZINE HCL 25 MG PO TABS: 25.0000 mg | ORAL_TABLET | Freq: Three times a day (TID) | ORAL | 0 refills | Status: DC | PRN

## 2022-04-11 ENCOUNTER — Other Ambulatory Visit: Payer: Self-pay | Admitting: Family Medicine

## 2022-04-11 DIAGNOSIS — G43109 Migraine with aura, not intractable, without status migrainosus: Secondary | ICD-10-CM

## 2022-05-14 ENCOUNTER — Other Ambulatory Visit: Payer: Self-pay | Admitting: Family Medicine

## 2022-05-14 NOTE — Telephone Encounter (Signed)
Unable to refill per protocol, Rx expired. Discontinued 11/06/21.  Requested Prescriptions  Pending Prescriptions Disp Refills   DULoxetine (CYMBALTA) 60 MG capsule [Pharmacy Med Name: DULOXETINE DR 60MG  CAPSULES] 90 capsule 3    Sig: TAKE 1 CAPSULE(60 MG) BY MOUTH DAILY     Psychiatry: Antidepressants - SNRI - duloxetine Failed - 05/14/2022  8:04 AM      Failed - Cr in normal range and within 360 days    Creat  Date Value Ref Range Status  11/06/2021 1.30 (H) 0.50 - 0.99 mg/dL Final         Failed - Last BP in normal range    BP Readings from Last 1 Encounters:  03/05/22 (!) 144/81         Failed - Valid encounter within last 6 months    Recent Outpatient Visits           1 year ago Nonintractable chronic migraine   [place] Pickard, Cammie Mcgee, MD   1 year ago Nonintractable chronic migraine   [place] Pickard, Cammie Mcgee, MD   2 years ago General medical exam   [place] Susy Frizzle, MD   3 years ago Essential hypertension   [place], Warren T, MD   3 years ago Migraine with aura and without status migrainosus, not intractable   [place], Cammie Mcgee, MD              Passed - eGFR is 30 or above and within 360 days    GFR, Est African American  Date Value Ref Range Status  03/21/2020 60 > OR = 60 mL/min/1.85m2 Final   GFR, Est Non African American  Date Value Ref Range Status  03/21/2020 52 (L) > OR = 60 mL/min/1.42m2 Final   eGFR  Date Value Ref Range Status  11/06/2021 51 (L) > OR = 60 mL/min/1.3m2 Final         Passed - Completed PHQ-2 or PHQ-9 in the last 360 days

## 2022-06-11 ENCOUNTER — Ambulatory Visit: Payer: Federal, State, Local not specified - PPO | Admitting: Neurology

## 2022-06-14 ENCOUNTER — Ambulatory Visit: Payer: Federal, State, Local not specified - PPO | Admitting: Neurology

## 2022-06-18 ENCOUNTER — Other Ambulatory Visit: Payer: Self-pay | Admitting: Family Medicine

## 2022-06-18 NOTE — Telephone Encounter (Signed)
Notes to clinic: Duplicate request- please review   Requested Prescriptions  Pending Prescriptions Disp Refills   topiramate (TOPAMAX) 50 MG tablet [Pharmacy Med Name: TOPIRAMATE 50MG  TABLETS] 180 tablet     Sig: TAKE 1 TABLET(50 MG) BY MOUTH TWICE DAILY     Neurology: Anticonvulsants - topiramate & zonisamide Failed - 06/18/2022  2:24 PM      Failed - Cr in normal range and within 360 days    Creat  Date Value Ref Range Status  11/06/2021 1.30 (H) 0.50 - 0.99 mg/dL Final         Failed - CO2 in normal range and within 360 days    CO2  Date Value Ref Range Status  11/06/2021 19 (L) 20 - 32 mmol/L Final         Failed - Valid encounter within last 12 months    Recent Outpatient Visits           1 year ago Nonintractable chronic migraine   [facility] Pickard, Priscille Heidelberg, MD   1 year ago Nonintractable chronic migraine   [facility] Pickard, Priscille Heidelberg, MD   2 years ago General medical exam   [facility] Donita Brooks, MD   3 years ago Essential hypertension   [facility] Tanya Nones, Priscille Heidelberg, MD   4 years ago Migraine with aura and without status migrainosus, not intractable   [facility] Pickard, Priscille Heidelberg, MD              Passed - ALT in normal range and within 360 days    ALT  Date Value Ref Range Status  11/06/2021 20 6 - 29 U/L Final         Passed - AST in normal range and within 360 days    AST  Date Value Ref Range Status  11/06/2021 13 10 - 35 U/L Final         Passed - Completed PHQ-2 or PHQ-9 in the last 360 days         Requested Prescriptions  Pending Prescriptions Disp Refills   topiramate (TOPAMAX) 50 MG tablet [Pharmacy Med Name: TOPIRAMATE 50MG  TABLETS] 180 tablet     Sig: TAKE 1 TABLET(50 MG) BY MOUTH TWICE DAILY     Neurology: Anticonvulsants - topiramate & zonisamide Failed - 06/18/2022  2:24 PM      Failed - Cr in normal range and within 360 days     Creat  Date Value Ref Range Status  11/06/2021 1.30 (H) 0.50 - 0.99 mg/dL Final         Failed - CO2 in normal range and within 360 days    CO2  Date Value Ref Range Status  11/06/2021 19 (L) 20 - 32 mmol/L Final         Failed - Valid encounter within last 12 months    Recent Outpatient Visits           1 year ago Nonintractable chronic migraine   [facility] Pickard, Priscille Heidelberg, MD   1 year ago Nonintractable chronic migraine   [facility] Pickard, Priscille Heidelberg, MD   2 years ago General medical exam   [facility] Donita Brooks, MD   3 years ago Essential hypertension   [facility] Donita Brooks, MD   4 years ago Migraine with aura and without status migrainosus, not intractable  [facility] Pickard, Priscille Heidelberg, MD              Passed - ALT in normal range and within 360 days    ALT  Date Value Ref Range Status  11/06/2021 20 6 - 29 U/L Final         Passed - AST in normal range and within 360 days    AST  Date Value Ref Range Status  11/06/2021 13 10 - 35 U/L Final         Passed - Completed PHQ-2 or PHQ-9 in the last 360 days

## 2022-06-18 NOTE — Telephone Encounter (Signed)
Patient needs OV, will refill for 30 days until OV can be made. Patient needs OV for additional refills.  Requested Prescriptions  Pending Prescriptions Disp Refills   topiramate (TOPAMAX) 50 MG tablet [Pharmacy Med Name: TOPIRAMATE 50MG  TABLETS] 60 tablet 0    Sig: TAKE 1 TABLET(50 MG) BY MOUTH TWICE DAILY     Neurology: Anticonvulsants - topiramate & zonisamide Failed - 06/18/2022  9:55 AM      Failed - Cr in normal range and within 360 days    Creat  Date Value Ref Range Status  11/06/2021 1.30 (H) 0.50 - 0.99 mg/dL Final         Failed - CO2 in normal range and within 360 days    CO2  Date Value Ref Range Status  11/06/2021 19 (L) 20 - 32 mmol/L Final         Failed - Valid encounter within last 12 months    Recent Outpatient Visits           1 year ago Nonintractable chronic migraine   [facility] Pickard, Priscille Heidelberg, MD   1 year ago Nonintractable chronic migraine   [facility] Pickard, Priscille Heidelberg, MD   2 years ago General medical exam   [facility] Donita Brooks, MD   3 years ago Essential hypertension   [facility] Donita Brooks, MD   4 years ago Migraine with aura and without status migrainosus, not intractable   [facility] Pickard, Priscille Heidelberg, MD              Passed - ALT in normal range and within 360 days    ALT  Date Value Ref Range Status  11/06/2021 20 6 - 29 U/L Final         Passed - AST in normal range and within 360 days    AST  Date Value Ref Range Status  11/06/2021 13 10 - 35 U/L Final         Passed - Completed PHQ-2 or PHQ-9 in the last 360 days

## 2022-06-18 NOTE — Telephone Encounter (Signed)
Requested medication (s) are due for refill today - no  Requested medication (s) are on the active medication list -yes  Future visit scheduled -no  Last refill: 06/18/22  Notes to clinic: In review of request for 90 day Rx- discrepancy of once daily vs twice daily- sent for provider review   Requested Prescriptions  Pending Prescriptions Disp Refills   topiramate (TOPAMAX) 50 MG tablet [Pharmacy Med Name: TOPIRAMATE 50MG  TABLETS] 180 tablet     Sig: TAKE 1 TABLET(50 MG) BY MOUTH TWICE DAILY     Neurology: Anticonvulsants - topiramate & zonisamide Failed - 06/18/2022  2:24 PM      Failed - Cr in normal range and within 360 days    Creat  Date Value Ref Range Status  11/06/2021 1.30 (H) 0.50 - 0.99 mg/dL Final         Failed - CO2 in normal range and within 360 days    CO2  Date Value Ref Range Status  11/06/2021 19 (L) 20 - 32 mmol/L Final         Failed - Valid encounter within last 12 months    Recent Outpatient Visits           1 year ago Nonintractable chronic migraine   [facility] Pickard, Priscille Heidelberg, MD   1 year ago Nonintractable chronic migraine   [facility] Pickard, Priscille Heidelberg, MD   2 years ago General medical exam   [facility] Donita Brooks, MD   3 years ago Essential hypertension   [facility] Tanya Nones, Priscille Heidelberg, MD   4 years ago Migraine with aura and without status migrainosus, not intractable   [facility] Pickard, Priscille Heidelberg, MD              Passed - ALT in normal range and within 360 days    ALT  Date Value Ref Range Status  11/06/2021 20 6 - 29 U/L Final         Passed - AST in normal range and within 360 days    AST  Date Value Ref Range Status  11/06/2021 13 10 - 35 U/L Final         Passed - Completed PHQ-2 or PHQ-9 in the last 360 days         Requested Prescriptions  Pending Prescriptions Disp Refills   topiramate (TOPAMAX) 50 MG tablet  [Pharmacy Med Name: TOPIRAMATE 50MG  TABLETS] 180 tablet     Sig: TAKE 1 TABLET(50 MG) BY MOUTH TWICE DAILY     Neurology: Anticonvulsants - topiramate & zonisamide Failed - 06/18/2022  2:24 PM      Failed - Cr in normal range and within 360 days    Creat  Date Value Ref Range Status  11/06/2021 1.30 (H) 0.50 - 0.99 mg/dL Final         Failed - CO2 in normal range and within 360 days    CO2  Date Value Ref Range Status  11/06/2021 19 (L) 20 - 32 mmol/L Final         Failed - Valid encounter within last 12 months    Recent Outpatient Visits           1 year ago Nonintractable chronic migraine   [facility] Pickard, Priscille Heidelberg, MD   1 year ago Nonintractable chronic migraine   [facility] Pickard, Priscille Heidelberg, MD   2 years ago  General medical exam   [facility] Donita Brooks, MD   3 years ago Essential hypertension   [facility] Donita Brooks, MD   4 years ago Migraine with aura and without status migrainosus, not intractable   [facility] Pickard, Priscille Heidelberg, MD              Passed - ALT in normal range and within 360 days    ALT  Date Value Ref Range Status  11/06/2021 20 6 - 29 U/L Final         Passed - AST in normal range and within 360 days    AST  Date Value Ref Range Status  11/06/2021 13 10 - 35 U/L Final         Passed - Completed PHQ-2 or PHQ-9 in the last 360 days
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
## 2022-06-24 ENCOUNTER — Encounter: Payer: Self-pay | Admitting: Neurology

## 2022-06-24 ENCOUNTER — Ambulatory Visit: Payer: Federal, State, Local not specified - PPO | Admitting: Neurology

## 2022-06-24 DIAGNOSIS — Z029 Encounter for administrative examinations, unspecified: Secondary | ICD-10-CM

## 2022-07-09 ENCOUNTER — Encounter: Payer: Self-pay | Admitting: Family Medicine

## 2022-07-15 ENCOUNTER — Ambulatory Visit: Payer: Federal, State, Local not specified - PPO | Admitting: Family Medicine

## 2022-07-21 ENCOUNTER — Ambulatory Visit: Payer: Federal, State, Local not specified - PPO | Admitting: Family Medicine

## 2022-08-10 ENCOUNTER — Encounter: Payer: Federal, State, Local not specified - PPO | Admitting: Family Medicine

## 2022-09-03 ENCOUNTER — Ambulatory Visit: Payer: Federal, State, Local not specified - PPO | Admitting: Neurology

## 2022-09-03 ENCOUNTER — Ambulatory Visit: Payer: Federal, State, Local not specified - PPO | Admitting: Family Medicine

## 2022-09-03 DIAGNOSIS — G43719 Chronic migraine without aura, intractable, without status migrainosus: Secondary | ICD-10-CM | POA: Diagnosis not present

## 2022-09-03 MED ORDER — ONABOTULINUMTOXINA 100 UNITS IJ SOLR
200.0000 [IU] | Freq: Once | INTRAMUSCULAR | Status: AC
  Administered 2022-09-03: 155 [IU] via INTRAMUSCULAR

## 2022-09-03 NOTE — Progress Notes (Signed)
[facility]  ° °Procedure Note Botox ° °Attending: Dr. Anaka Beazer ° °Preoperative Diagnosis(es): Chronic migraine ° °Consent obtained from: The patient °Benefits discussed included, but were not limited to decreased muscle tightness, increased joint range of motion, and decreased pain.  Risk discussed included, but were not limited pain and discomfort, bleeding, bruising, excessive weakness, venous thrombosis, muscle atrophy and dysphagia.  Anticipated outcomes of the procedure as well as he risks and benefits of the alternatives to the procedure, and the roles and tasks of the personnel to be involved, were discussed with the patient, and the patient consents to the procedure and agrees to proceed. A copy of the patient medication guide was given to the patient which explains the blackbox warning. ° °Patients identity and treatment sites confirmed Yes.  . ° °Details of Procedure: °Skin was cleaned with alcohol. Prior to injection, the needle plunger was aspirated to make sure the needle was not within a blood vessel.  There was no blood retrieved on aspiration.   ° °Following is a summary of the muscles injected  And the amount of Botulinum toxin used: ° °Dilution °200 units of Botox was reconstituted with 4 ml of preservative free normal saline. °Time of reconstitution: At the time of the office visit (<30 minutes prior to injection)  ° °Injections  °155 total units of Botox was injected with a 30 gauge needle. ° °Injection Sites: °L occipitalis: 15 units- 3 sites  °R occiptalis: 15 units- 3 sites ° °L upper trapezius: 15 units- 3 sites °R upper trapezius: 15 units- 3 sits          °L paraspinal: 10 units- 2 sites °R paraspinal: 10 units- 2 sites ° °Face °L frontalis(2 injection sites):10 units   °R frontalis(2 injection sites):10 units         °L corrugator: 5 units   °R corrugator: 5 units           °Procerus: 5 units   °L temporalis: 20 units °R temporalis: 20 units  ° °Agent:  °200 units of botulinum Type  A (Onobotulinum Toxin type A) was reconstituted with 4 ml of preservative free normal saline.  °Time of reconstitution: At the time of the office visit (<30 minutes prior to injection)  ° ° ° Total injected (Units):  155 ° Total wasted (Units):  45 ° °Patient tolerated procedure well without complications.   °Reinjection is anticipated in 3 months. ° ° °

## 2022-09-20 ENCOUNTER — Other Ambulatory Visit: Payer: Self-pay | Admitting: Family Medicine

## 2022-10-29 ENCOUNTER — Other Ambulatory Visit: Payer: Self-pay

## 2022-10-29 ENCOUNTER — Encounter: Payer: Self-pay | Admitting: Family Medicine

## 2022-11-01 ENCOUNTER — Other Ambulatory Visit: Payer: Self-pay | Admitting: Family Medicine

## 2022-11-01 MED ORDER — BUPROPION HCL ER (XL) 150 MG PO TB24: 150.0000 mg | ORAL_TABLET | Freq: Every day | ORAL | 3 refills | Status: DC

## 2022-11-01 MED ORDER — LOSARTAN POTASSIUM 100 MG PO TABS: 100.0000 mg | ORAL_TABLET | Freq: Every day | ORAL | 1 refills | Status: DC

## 2022-11-01 MED ORDER — ATORVASTATIN CALCIUM 40 MG PO TABS: ORAL_TABLET | ORAL | 0 refills | Status: DC

## 2022-11-01 MED ORDER — DULOXETINE HCL 60 MG PO CPEP: 60.0000 mg | ORAL_CAPSULE | Freq: Every day | ORAL | 1 refills | Status: AC

## 2022-11-01 MED ORDER — METOPROLOL SUCCINATE ER 50 MG PO TB24: ORAL_TABLET | ORAL | 0 refills | Status: DC

## 2022-11-01 MED ORDER — HYDROCHLOROTHIAZIDE 25 MG PO TABS: 25.0000 mg | ORAL_TABLET | Freq: Every day | ORAL | 3 refills | Status: DC

## 2022-11-01 MED ORDER — AMITRIPTYLINE HCL 50 MG PO TABS: 50.0000 mg | ORAL_TABLET | Freq: Every day | ORAL | 3 refills | Status: DC

## 2022-11-01 MED ORDER — AIMOVIG 140 MG/ML ~~LOC~~ SOAJ: 140.0000 mg | SUBCUTANEOUS | 5 refills | Status: DC

## 2022-11-01 MED ORDER — OMEPRAZOLE 40 MG PO CPDR: DELAYED_RELEASE_CAPSULE | ORAL | 1 refills | Status: DC

## 2022-11-01 MED ORDER — CELECOXIB 200 MG PO CAPS: 200.0000 mg | ORAL_CAPSULE | Freq: Every day | ORAL | 0 refills | Status: DC

## 2022-11-02 NOTE — Telephone Encounter (Signed)
Requested Prescriptions  Refused Prescriptions Disp Refills   metoprolol succinate (TOPROL-XL) 50 MG 24 hr tablet [Pharmacy Med Name: METOPROLOL ER SUCCINATE 50MG  TABS] 90 tablet     Sig: TAKE 1 TABLET BY MOUTH EVERY DAY WITH OR FOLLOWING A MEAL     Cardiovascular:  Beta Blockers Failed - 11/01/2022  8:29 AM      Failed - Last BP in normal range    BP Readings from Last 1 Encounters:  03/05/22 (!) 144/81         Failed - Valid encounter within last 6 months    Recent Outpatient Visits           1 year ago Nonintractable chronic migraine   [facility] Tanya Nones, Priscille Heidelberg, MD   1 year ago Nonintractable chronic migraine   [facility] Pickard, Priscille Heidelberg, MD   2 years ago General medical exam   [facility] Donita Brooks, MD   3 years ago Essential hypertension   [facility] Donita Brooks, MD   4 years ago Migraine with aura and without status migrainosus, not intractable   [facility] Pickard, Priscille Heidelberg, MD              Passed - Last Heart Rate in normal range    Pulse Readings from Last 1 Encounters:  03/05/22 84

## 2022-11-15 ENCOUNTER — Other Ambulatory Visit: Payer: Self-pay | Admitting: Neurology

## 2022-11-17 ENCOUNTER — Encounter: Payer: Self-pay | Admitting: Family Medicine

## 2022-12-03 ENCOUNTER — Ambulatory Visit: Payer: Federal, State, Local not specified - PPO | Admitting: Neurology

## 2022-12-03 DIAGNOSIS — G43719 Chronic migraine without aura, intractable, without status migrainosus: Secondary | ICD-10-CM

## 2022-12-03 MED ORDER — ONABOTULINUMTOXINA 100 UNITS IJ SOLR
200.0000 [IU] | Freq: Once | INTRAMUSCULAR | Status: AC
  Administered 2022-12-03: 155 [IU] via INTRAMUSCULAR

## 2022-12-06 NOTE — Progress Notes (Signed)

## 2022-12-20 ENCOUNTER — Encounter: Payer: Self-pay | Admitting: Family Medicine

## 2022-12-20 ENCOUNTER — Ambulatory Visit: Payer: Federal, State, Local not specified - PPO | Admitting: Family Medicine

## 2022-12-20 VITALS — BP 140/90 | HR 108 | Temp 98.1°F | Ht 61.0 in | Wt 160.2 lb

## 2022-12-20 DIAGNOSIS — R002 Palpitations: Secondary | ICD-10-CM | POA: Diagnosis not present
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
## 2022-12-21 ENCOUNTER — Other Ambulatory Visit: Payer: Self-pay

## 2022-12-21 DIAGNOSIS — R002 Palpitations: Secondary | ICD-10-CM

## 2022-12-21 DIAGNOSIS — I10 Essential (primary) hypertension: Secondary | ICD-10-CM

## 2022-12-21 LAB — COMPLETE METABOLIC PANEL WITH GFR
AG Ratio: 1.6 (calc) (ref 1.0–2.5)
ALT: 9 U/L (ref 6–29)
AST: 13 U/L (ref 10–35)
Albumin: 4.1 g/dL (ref 3.6–5.1)
Alkaline phosphatase (APISO): 70 U/L (ref 31–125)
BUN/Creatinine Ratio: 12 (calc) (ref 6–22)
BUN: 13 mg/dL (ref 7–25)
CO2: 18 mmol/L — ABNORMAL LOW (ref 20–32)
Calcium: 9.3 mg/dL (ref 8.6–10.2)
Chloride: 104 mmol/L (ref 98–110)
Creat: 1.08 mg/dL — ABNORMAL HIGH (ref 0.50–0.99)
Globulin: 2.6 g/dL (ref 1.9–3.7)
Glucose, Bld: 79 mg/dL (ref 65–99)
Potassium: 4 mmol/L (ref 3.5–5.3)
Sodium: 136 mmol/L (ref 135–146)
Total Bilirubin: 0.3 mg/dL (ref 0.2–1.2)
Total Protein: 6.7 g/dL (ref 6.1–8.1)
eGFR: 63 mL/min/{1.73_m2} (ref >=60)

## 2022-12-21 LAB — CBC WITH DIFFERENTIAL/PLATELET
Absolute Lymphocytes: 2066 {cells}/uL (ref 850–3900)
Absolute Monocytes: 389 {cells}/uL (ref 200–950)
Basophils Absolute: 20 {cells}/uL (ref 0–200)
Basophils Relative: 0.3 %
Eosinophils Absolute: 53 {cells}/uL (ref 15–500)
Eosinophils Relative: 0.8 %
HCT: 39.3 % (ref 35.0–45.0)
Hemoglobin: 13.3 g/dL (ref 11.7–15.5)
MCH: 33.1 pg — ABNORMAL HIGH (ref 27.0–33.0)
MCHC: 33.8 g/dL (ref 32.0–36.0)
MCV: 97.8 fL (ref 80.0–100.0)
MPV: 9.1 fL (ref 7.5–12.5)
Monocytes Relative: 5.9 %
Neutro Abs: 4072 {cells}/uL (ref 1500–7800)
Neutrophils Relative %: 61.7 %
Platelets: 458 10*3/uL — ABNORMAL HIGH (ref 140–400)
RBC: 4.02 10*6/uL (ref 3.80–5.10)
RDW: 14.5 % (ref 11.0–15.0)
Total Lymphocyte: 31.3 %
WBC: 6.6 10*3/uL (ref 3.8–10.8)

## 2022-12-21 LAB — MAGNESIUM: Magnesium: 2 mg/dL (ref 1.5–2.5)

## 2022-12-21 LAB — TSH: TSH: 0.75 m[IU]/L

## 2022-12-21 MED ORDER — METOPROLOL SUCCINATE ER 50 MG PO TB24: ORAL_TABLET | ORAL | 1 refills | Status: DC

## 2022-12-22 ENCOUNTER — Other Ambulatory Visit: Payer: Self-pay | Admitting: Family Medicine

## 2022-12-22 ENCOUNTER — Other Ambulatory Visit: Payer: Self-pay

## 2022-12-22 DIAGNOSIS — I10 Essential (primary) hypertension: Secondary | ICD-10-CM

## 2022-12-22 DIAGNOSIS — R002 Palpitations: Secondary | ICD-10-CM

## 2022-12-22 MED ORDER — METOPROLOL SUCCINATE ER 100 MG PO TB24: 100.0000 mg | ORAL_TABLET | Freq: Every day | ORAL | 1 refills | Status: DC

## 2022-12-23 NOTE — Telephone Encounter (Signed)
Requested medications are due for refill today.  Zofran yes. Metoprolol is not.  Requested medications are on the active medications list.  yes  Last refill. Zofran 11/06/2021 #20 0 rf. Metoprolol 12/22/2022 #30 1 rf  Future visit scheduled.   no  Notes to clinic.  Zofran is not delegated. Metoprolol was refilled yesterday, pharmacy is requesting a 90 days supply.    Requested Prescriptions  Pending Prescriptions Disp Refills   metoprolol succinate (TOPROL-XL) 100 MG 24 hr tablet [Pharmacy Med Name: METOPROLOL ER SUCCINATE 100MG  TABS] 90 tablet     Sig: TAKE 1 TABLET(100 MG) BY MOUTH DAILY WITH OR IMMEDIATELY FOLLOWING A MEAL     Cardiovascular:  Beta Blockers Failed - 12/22/2022  4:33 PM      Failed - Last BP in normal range    BP Readings from Last 1 Encounters:  12/20/22 (!) 140/90         Failed - Valid encounter within last 6 months    Recent Outpatient Visits           1 year ago Nonintractable chronic migraine   [facility] Tanya Nones, Priscille Heidelberg, MD   1 year ago Nonintractable chronic migraine   [facility] Pickard, Priscille Heidelberg, MD   2 years ago General medical exam   [facility] Donita Brooks, MD   4 years ago Essential hypertension   [facility] Donita Brooks, MD   4 years ago Migraine with aura and without status migrainosus, not intractable   [facility] Pickard, Priscille Heidelberg, MD              Passed - Last Heart Rate in normal range    Pulse Readings from Last 1 Encounters:  12/20/22 (!) 108          ondansetron (ZOFRAN) 4 MG tablet [Pharmacy Med Name: ONDANSETRON 4MG  TABLETS] 20 tablet 0    Sig: TAKE 1 TABLET(4 MG) BY MOUTH EVERY 8 HOURS AS NEEDED FOR NAUSEA OR VOMITING     Not Delegated - Gastroenterology: Antiemetics - ondansetron Failed - 12/22/2022  4:33 PM      Failed - This refill cannot be delegated      Failed - Valid encounter within last 6 months    Recent  Outpatient Visits           1 year ago Nonintractable chronic migraine   [facility] Pickard, Priscille Heidelberg, MD   1 year ago Nonintractable chronic migraine   [facility] Pickard, Priscille Heidelberg, MD   2 years ago General medical exam   [facility] Donita Brooks, MD   4 years ago Essential hypertension   [facility] Donita Brooks, MD   4 years ago Migraine with aura and without status migrainosus, not intractable   [facility] Pickard, Priscille Heidelberg, MD              Passed - AST in normal range and within 360 days    AST  Date Value Ref Range Status  12/20/2022 13 10 - 35 U/L Final         Passed - ALT in normal range and within 360 days    ALT  Date Value Ref Range Status  12/20/2022 9 6 - 29 U/L Final

## 2023-01-05 ENCOUNTER — Other Ambulatory Visit: Payer: Self-pay | Admitting: Family Medicine

## 2023-01-06 NOTE — Telephone Encounter (Signed)
Requested medications are due for refill today.  yes  Requested medications are on the active medications list.  yes  Last refill. 11/01/2022 #90 0 rf  Future visit scheduled.   no  Notes to clinic.  Labs are expired.    Requested Prescriptions  Pending Prescriptions Disp Refills   atorvastatin (LIPITOR) 40 MG tablet [Pharmacy Med Name: ATORVASTATIN 40MG  TABLETS] 90 tablet 0    Sig: TAKE 1 TABLET(40 MG) BY MOUTH DAILY     Cardiovascular:  Antilipid - Statins Failed - 01/05/2023 12:06 PM      Failed - Valid encounter within last 12 months    Recent Outpatient Visits           1 year ago Nonintractable chronic migraine   [facility] Pickard, Priscille Heidelberg, MD   1 year ago Nonintractable chronic migraine   [facility] Pickard, Priscille Heidelberg, MD   2 years ago General medical exam   [facility] Donita Brooks, MD   4 years ago Essential hypertension   [facility] Donita Brooks, MD   4 years ago Migraine with aura and without status migrainosus, not intractable   [facility] Pickard, Priscille Heidelberg, MD              Failed - Lipid Panel in normal range within the last 12 months    Cholesterol, Total  Date Value Ref Range Status  01/20/2017 226 (H) 100 - 199 mg/dL Final   Cholesterol  Date Value Ref Range Status  11/06/2021 138 <200 mg/dL Final   LDL Cholesterol (Calc)  Date Value Ref Range Status  11/06/2021 69 mg/dL (calc) Final    Comment:    Reference range: <100 . Desirable range <100 mg/dL for primary prevention;   <70 mg/dL for patients with CHD or diabetic patients  with > or = 2 CHD risk factors. Marland Kitchen LDL-C is now calculated using the Staub-Hopkins  calculation, which is a validated novel method providing  better accuracy than the Friedewald equation in the  estimation of LDL-C.  Horald Pollen et al. Lenox Ahr. 8119;147(82): 2061-2068  (http://education.[facility].com/faq/FAQ164)     HDL  Date Value Ref Range Status  11/06/2021 44 (L) > OR = 50 mg/dL Final  95/62/1308 42 >65 mg/dL Final   Triglycerides  Date Value Ref Range Status  11/06/2021 167 (H) <150 mg/dL Final         Passed - Patient is not pregnant

## 2023-01-09 ENCOUNTER — Encounter: Payer: Self-pay | Admitting: Family Medicine

## 2023-01-11 ENCOUNTER — Other Ambulatory Visit: Payer: Self-pay | Admitting: Family Medicine

## 2023-01-11 MED ORDER — ONDANSETRON HCL 4 MG PO TABS: 4.0000 mg | ORAL_TABLET | Freq: Three times a day (TID) | ORAL | 0 refills | Status: DC | PRN

## 2023-01-11 MED ORDER — MECLIZINE HCL 25 MG PO TABS: 25.0000 mg | ORAL_TABLET | Freq: Three times a day (TID) | ORAL | 0 refills | Status: DC | PRN

## 2023-01-18 ENCOUNTER — Other Ambulatory Visit: Payer: Self-pay

## 2023-01-18 DIAGNOSIS — E785 Hyperlipidemia, unspecified: Secondary | ICD-10-CM

## 2023-01-18 MED ORDER — ATORVASTATIN CALCIUM 40 MG PO TABS: ORAL_TABLET | ORAL | 0 refills | Status: DC
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
## 2023-01-28 ENCOUNTER — Ambulatory Visit: Payer: Federal, State, Local not specified - PPO | Admitting: Neurology

## 2023-01-28 ENCOUNTER — Encounter: Payer: Self-pay | Admitting: Neurology

## 2023-01-28 DIAGNOSIS — Z029 Encounter for administrative examinations, unspecified: Secondary | ICD-10-CM

## 2023-01-31 ENCOUNTER — Telehealth: Payer: Self-pay | Admitting: Neurology

## 2023-01-31 NOTE — Telephone Encounter (Signed)
Patient dismissed from [facility] and all providers practicing at this clinic will no longer be able to provide medical care to you. The decision was based on our NO SHOW / LATE POLICY.  01/28/23

## 2023-02-01 ENCOUNTER — Other Ambulatory Visit: Payer: Self-pay | Admitting: Family Medicine

## 2023-02-01 DIAGNOSIS — E785 Hyperlipidemia, unspecified: Secondary | ICD-10-CM

## 2023-03-07 ENCOUNTER — Encounter: Payer: Self-pay | Admitting: Family Medicine

## 2023-03-09 ENCOUNTER — Other Ambulatory Visit: Payer: Self-pay | Admitting: Family Medicine

## 2023-03-09 ENCOUNTER — Other Ambulatory Visit: Payer: Self-pay

## 2023-03-09 ENCOUNTER — Ambulatory Visit: Payer: Federal, State, Local not specified - PPO | Attending: Family Medicine

## 2023-03-09 DIAGNOSIS — R002 Palpitations: Secondary | ICD-10-CM

## 2023-03-09 DIAGNOSIS — I10 Essential (primary) hypertension: Secondary | ICD-10-CM

## 2023-03-09 NOTE — Progress Notes (Unsigned)
 EP to read.

## 2023-03-10 NOTE — Telephone Encounter (Signed)
Losartan, duloxetine,omeprazole- Rx 11/01/22 #90 1RF- too soon Celecoxib- discontinued- no longer listed on current medication list Requested Prescriptions  Pending Prescriptions Disp Refills   metoprolol succinate (TOPROL-XL) 100 MG 24 hr tablet [Pharmacy Med Name: METOPROLOL ER SUCCINATE 100MG  TABS] 30 tablet 1    Sig: TAKE 1 TABLET(100 MG) BY MOUTH DAILY WITH OR IMMEDIATELY FOLLOWING A MEAL     Cardiovascular:  Beta Blockers Failed - 03/10/2023 11:43 AM      Failed - Last BP in normal range    BP Readings from Last 1 Encounters:  12/20/22 (!) 140/90         Failed - Valid encounter within last 6 months    Recent Outpatient Visits           2 years ago Nonintractable chronic migraine   [facility] Donita Brooks, MD   2 years ago Nonintractable chronic migraine   [facility] Pickard, Priscille Heidelberg, MD   2 years ago General medical exam   [facility] Donita Brooks, MD   4 years ago Essential hypertension   [facility] Tanya Nones, Priscille Heidelberg, MD   4 years ago Migraine with aura and without status migrainosus, not intractable   [facility] Pickard, Priscille Heidelberg, MD              Passed - Last Heart Rate in normal range    Pulse Readings from Last 1 Encounters:  12/20/22 (!) 108          losartan (COZAAR) 100 MG tablet [Pharmacy Med Name: LOSARTAN 100MG  TABLETS] 90 tablet 1    Sig: TAKE 1 TABLET(100 MG) BY MOUTH DAILY     Cardiovascular:  Angiotensin Receptor Blockers Failed - 03/10/2023 11:43 AM      Failed - Cr in normal range and within 180 days    Creat  Date Value Ref Range Status  12/20/2022 1.08 (H) 0.50 - 0.99 mg/dL Final         Failed - Last BP in normal range    BP Readings from Last 1 Encounters:  12/20/22 (!) 140/90         Failed - Valid encounter within last 6 months    Recent Outpatient Visits           2 years ago Nonintractable chronic migraine   [facility] Tanya Nones, Priscille Heidelberg, MD   2 years ago Nonintractable chronic migraine   [facility] Pickard, Priscille Heidelberg, MD   2 years ago General medical exam   [facility] Donita Brooks, MD   4 years ago Essential hypertension   [facility] Donita Brooks, MD   4 years ago Migraine with aura and without status migrainosus, not intractable   [facility] Pickard, Priscille Heidelberg, MD              Passed - K in normal range and within 180 days    Potassium  Date Value Ref Range Status  12/20/2022 4.0 3.5 - 5.3 mmol/L Final         Passed - Patient is not pregnant       omeprazole (PRILOSEC) 40 MG capsule [Pharmacy Med Name: OMEPRAZOLE 40MG  CAPSULES] 90 capsule 1    Sig: TAKE 1 CAPSULE(40 MG) BY MOUTH DAILY     Gastroenterology: Proton Pump Inhibitors Failed - 03/10/2023 11:43 AM      Failed -  Valid encounter within last 12 months    Recent Outpatient Visits           2 years ago Nonintractable chronic migraine   [facility] Donita Brooks, MD   2 years ago Nonintractable chronic migraine   [facility] Tanya Nones, Priscille Heidelberg, MD   2 years ago General medical exam   [facility] Donita Brooks, MD   4 years ago Essential hypertension   [facility] Tanya Nones, Priscille Heidelberg, MD   4 years ago Migraine with aura and without status migrainosus, not intractable   [facility] Pickard, Priscille Heidelberg, MD               DULoxetine (CYMBALTA) 60 MG capsule [Pharmacy Med Name: DULOXETINE DR 60MG  CAPSULES] 90 capsule 1    Sig: TAKE 1 CAPSULE(60 MG) BY MOUTH DAILY     Psychiatry: Antidepressants - SNRI - duloxetine Failed - 03/10/2023 11:43 AM      Failed - Cr in normal range and within 360 days    Creat  Date Value Ref Range Status  12/20/2022 1.08 (H) 0.50 - 0.99 mg/dL Final         Failed - Last BP in normal range    BP Readings from Last 1  Encounters:  12/20/22 (!) 140/90         Failed - Valid encounter within last 6 months    Recent Outpatient Visits           2 years ago Nonintractable chronic migraine   [facility] Donita Brooks, MD   2 years ago Nonintractable chronic migraine   [facility] Pickard, Priscille Heidelberg, MD   2 years ago General medical exam   [facility] Donita Brooks, MD   4 years ago Essential hypertension   [facility] Tanya Nones, Priscille Heidelberg, MD   4 years ago Migraine with aura and without status migrainosus, not intractable   [facility] Medicine Pickard, Priscille Heidelberg, MD              Passed - eGFR is 30 or above and within 360 days    GFR, Est African American  Date Value Ref Range Status  03/21/2020 60 > OR = 60 mL/min/1.54m2 Final   GFR, Est Non African American  Date Value Ref Range Status  03/21/2020 52 (L) > OR = 60 mL/min/1.38m2 Final   eGFR  Date Value Ref Range Status  12/20/2022 63 > OR = 60 mL/min/1.82m2 Final         Passed - Completed PHQ-2 or PHQ-9 in the last 360 days       celecoxib (CELEBREX) 200 MG capsule [Pharmacy Med Name: CELECOXIB 200MG  CAPSULES] 180 capsule 0    Sig: TAKE ONE CAPSULE BY MOUTH DAILY     Analgesics:  COX2 Inhibitors Failed - 03/10/2023 11:43 AM      Failed - Manual Review: Labs are only required if the patient has taken medication for more than 8 weeks.      Failed - Cr in normal range and within 360 days    Creat  Date Value Ref Range Status  12/20/2022 1.08 (H) 0.50 - 0.99 mg/dL Final         Failed - Valid encounter within last 12 months    Recent Outpatient Visits           2 years ago  Nonintractable chronic migraine   [facility] Pickard, Priscille Heidelberg, MD   2 years ago Nonintractable chronic migraine   [facility] Pickard, Priscille Heidelberg, MD   2 years ago General medical exam   [facility] Donita Brooks, MD   4  years ago Essential hypertension   [facility] Tanya Nones, Priscille Heidelberg, MD   4 years ago Migraine with aura and without status migrainosus, not intractable    Medicine Pickard, Priscille Heidelberg, MD              Passed - HGB in normal range and within 360 days    Hemoglobin  Date Value Ref Range Status  12/20/2022 13.3 11.7 - 15.5 g/dL Final  18/84/1660 63.0 11.1 - 15.9 g/dL Final         Passed - HCT in normal range and within 360 days    HCT  Date Value Ref Range Status  12/20/2022 39.3 35.0 - 45.0 % Final   Hematocrit  Date Value Ref Range Status  01/20/2017 35.5 34.0 - 46.6 % Final         Passed - AST in normal range and within 360 days    AST  Date Value Ref Range Status  12/20/2022 13 10 - 35 U/L Final         Passed - ALT in normal range and within 360 days    ALT  Date Value Ref Range Status  12/20/2022 9 6 - 29 U/L Final         Passed - eGFR is 30 or above and within 360 days    GFR, Est African American  Date Value Ref Range Status  03/21/2020 60 > OR = 60 mL/min/1.15m2 Final   GFR, Est Non African American  Date Value Ref Range Status  03/21/2020 52 (L) > OR = 60 mL/min/1.76m2 Final   eGFR  Date Value Ref Range Status  12/20/2022 63 > OR = 60 mL/min/1.30m2 Final         Passed - Patient is not pregnant

## 2023-03-11 ENCOUNTER — Ambulatory Visit: Payer: Federal, State, Local not specified - PPO | Admitting: Neurology

## 2023-03-13 DIAGNOSIS — R002 Palpitations: Secondary | ICD-10-CM | POA: Diagnosis not present

## 2023-03-15 ENCOUNTER — Other Ambulatory Visit: Payer: Self-pay | Admitting: Family Medicine

## 2023-03-15 NOTE — Telephone Encounter (Signed)
Requested Prescriptions  Refused Prescriptions Disp Refills   losartan (COZAAR) 100 MG tablet [Pharmacy Med Name: LOSARTAN 100MG  TABLETS] 90 tablet 1    Sig: TAKE 1 TABLET BY MOUTH DAILY     Cardiovascular:  Angiotensin Receptor Blockers Failed - 03/15/2023  1:06 PM      Failed - Cr in normal range and within 180 days    Creat  Date Value Ref Range Status  12/20/2022 1.08 (H) 0.50 - 0.99 mg/dL Final         Failed - Last BP in normal range    BP Readings from Last 1 Encounters:  12/20/22 (!) 140/90         Failed - Valid encounter within last 6 months    Recent Outpatient Visits           2 years ago Nonintractable chronic migraine   [facility] Tanya Nones, Priscille Heidelberg, MD   2 years ago Nonintractable chronic migraine   [facility] Pickard, Priscille Heidelberg, MD   2 years ago General medical exam   [facility] Donita Brooks, MD   4 years ago Essential hypertension   [facility] Donita Brooks, MD   4 years ago Migraine with aura and without status migrainosus, not intractable   [facility] Pickard, Priscille Heidelberg, MD              Passed - K in normal range and within 180 days    Potassium  Date Value Ref Range Status  12/20/2022 4.0 3.5 - 5.3 mmol/L Final         Passed - Patient is not pregnant       omeprazole (PRILOSEC) 40 MG capsule [Pharmacy Med Name: OMEPRAZOLE 40MG  CAPSULES] 90 capsule 1    Sig: TAKE 1 CAPSULE(40 MG) BY MOUTH DAILY     Gastroenterology: Proton Pump Inhibitors Failed - 03/15/2023  1:06 PM      Failed - Valid encounter within last 12 months    Recent Outpatient Visits           2 years ago Nonintractable chronic migraine   [facility] Tanya Nones, Priscille Heidelberg, MD   2 years ago Nonintractable chronic migraine   [facility] Pickard, Priscille Heidelberg, MD   2 years ago General medical exam   [facility] Donita Brooks, MD   4 years  ago Essential hypertension   [facility] Donita Brooks, MD   4 years ago Migraine with aura and without status migrainosus, not intractable   [facility] Pickard, Priscille Heidelberg, MD

## 2023-03-26 ENCOUNTER — Encounter: Payer: Self-pay | Admitting: Family Medicine

## 2023-03-28 ENCOUNTER — Other Ambulatory Visit: Payer: Self-pay

## 2023-03-28 MED ORDER — LOSARTAN POTASSIUM 100 MG PO TABS: 100.0000 mg | ORAL_TABLET | Freq: Every day | ORAL | 1 refills | Status: DC

## 2023-05-26 ENCOUNTER — Other Ambulatory Visit: Payer: Self-pay

## 2023-05-26 DIAGNOSIS — E785 Hyperlipidemia, unspecified: Secondary | ICD-10-CM

## 2023-05-26 MED ORDER — LOSARTAN POTASSIUM 100 MG PO TABS: 100.0000 mg | ORAL_TABLET | Freq: Every day | ORAL | 1 refills | Status: DC

## 2023-05-26 MED ORDER — ATORVASTATIN CALCIUM 40 MG PO TABS: ORAL_TABLET | ORAL | 0 refills | Status: DC

## 2023-05-30 ENCOUNTER — Other Ambulatory Visit: Payer: Self-pay | Admitting: Family Medicine

## 2023-05-30 MED ORDER — CELECOXIB 200 MG PO CAPS: 200.0000 mg | ORAL_CAPSULE | Freq: Two times a day (BID) | ORAL | 3 refills | Status: AC

## 2023-06-03 ENCOUNTER — Ambulatory Visit (INDEPENDENT_AMBULATORY_CARE_PROVIDER_SITE_OTHER): Admitting: Family Medicine

## 2023-06-03 ENCOUNTER — Encounter: Payer: Self-pay | Admitting: Family Medicine

## 2023-06-03 VITALS — BP 146/76 | HR 65 | Temp 97.6°F | Ht 61.0 in | Wt 172.5 lb

## 2023-06-03 DIAGNOSIS — R42 Dizziness and giddiness: Secondary | ICD-10-CM

## 2023-06-03 DIAGNOSIS — R55 Syncope and collapse: Secondary | ICD-10-CM

## 2023-06-03 MED ORDER — AMLODIPINE BESYLATE 5 MG PO TABS: 5.0000 mg | ORAL_TABLET | Freq: Every day | ORAL | 3 refills | Status: DC
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
## 2023-07-26 ENCOUNTER — Ambulatory Visit: Attending: Internal Medicine | Admitting: Internal Medicine

## 2023-07-26 ENCOUNTER — Encounter: Payer: Self-pay | Admitting: Internal Medicine

## 2023-07-26 VITALS — BP 128/64 | HR 130 | Ht 61.0 in | Wt 174.0 lb

## 2023-07-26 DIAGNOSIS — N951 Menopausal and female climacteric states: Secondary | ICD-10-CM

## 2023-07-26 DIAGNOSIS — R55 Syncope and collapse: Secondary | ICD-10-CM | POA: Diagnosis not present

## 2023-07-26 MED ORDER — IVABRADINE HCL 5 MG PO TABS: 5.0000 mg | ORAL_TABLET | Freq: Two times a day (BID) | ORAL | 3 refills | Status: DC
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
## 2023-07-26 NOTE — Patient Instructions (Addendum)
 Medication Instructions:  Your physician has recommended you make the following change in your medication:  Start taking Ivabradine 5 mg twice daily Continue taking all other medications as prescribed  Labwork: None  Testing/Procedures: None  Follow-Up: Your physician recommends that you schedule a follow-up appointment in: 3 months  Any Other Special Instructions Will Be Listed Below (If Applicable). Thank you for choosing  [facility]!     If you need a refill on your cardiac medications before your next appointment, please call your pharmacy.

## 2023-07-31 ENCOUNTER — Other Ambulatory Visit: Payer: Self-pay | Admitting: Family Medicine

## 2023-07-31 DIAGNOSIS — I10 Essential (primary) hypertension: Secondary | ICD-10-CM

## 2023-07-31 DIAGNOSIS — R002 Palpitations: Secondary | ICD-10-CM

## 2023-08-16 ENCOUNTER — Telehealth: Payer: Self-pay | Admitting: Pharmacy Technician

## 2023-08-16 NOTE — Telephone Encounter (Signed)
 Pharmacy Patient Advocate Encounter   Received notification from CoverMyMeds that prior authorization for Corlanor 5MG  tablet is required/requested.   Insurance verification completed.   The patient is insured through [facility] .   Per test claim: PA required; PA submitted to above mentioned insurance via CoverMyMeds Key/confirmation #/EOC [id number] Status is pending    Pharmacy Patient Advocate Encounter  Received notification from [facility] that Prior Authorization for Corlanor 5MG  tablets has been APPROVED from 07/17/23 to 08/15/24. SENT pt a Product/process development scientist closed

## 2023-08-17 NOTE — Telephone Encounter (Signed)
 I called [facility] and it is being for 15.00 for 30 days for generic. they said she can get it tomorrow afternoon. I called the patient and made her aware

## 2023-08-22 ENCOUNTER — Encounter: Payer: Self-pay | Admitting: Family Medicine

## 2023-08-23 ENCOUNTER — Other Ambulatory Visit: Payer: Self-pay

## 2023-08-23 MED ORDER — OMEPRAZOLE 40 MG PO CPDR: DELAYED_RELEASE_CAPSULE | ORAL | 1 refills | Status: DC

## 2023-10-25 ENCOUNTER — Ambulatory Visit: Admitting: Family Medicine

## 2023-10-26 ENCOUNTER — Encounter: Payer: Self-pay | Admitting: Internal Medicine

## 2023-10-26 ENCOUNTER — Ambulatory Visit: Attending: Internal Medicine | Admitting: Internal Medicine

## 2023-10-26 NOTE — Progress Notes (Signed)
 Erroneous encounter - please disregard.

## 2023-11-22 ENCOUNTER — Encounter: Payer: Self-pay | Admitting: Family Medicine

## 2023-11-22 ENCOUNTER — Other Ambulatory Visit: Payer: Self-pay

## 2023-11-22 ENCOUNTER — Other Ambulatory Visit: Payer: Self-pay | Admitting: Family Medicine

## 2023-11-22 DIAGNOSIS — E785 Hyperlipidemia, unspecified: Secondary | ICD-10-CM

## 2023-11-22 DIAGNOSIS — R55 Syncope and collapse: Secondary | ICD-10-CM

## 2023-11-22 DIAGNOSIS — R110 Nausea: Secondary | ICD-10-CM

## 2023-11-22 MED ORDER — ONDANSETRON HCL 4 MG PO TABS: 4.0000 mg | ORAL_TABLET | Freq: Three times a day (TID) | ORAL | 0 refills | Status: DC | PRN

## 2023-11-22 MED ORDER — ATORVASTATIN CALCIUM 40 MG PO TABS: ORAL_TABLET | ORAL | 0 refills | Status: DC

## 2023-12-28 ENCOUNTER — Other Ambulatory Visit: Payer: Self-pay

## 2023-12-28 ENCOUNTER — Telehealth: Payer: Self-pay

## 2023-12-28 DIAGNOSIS — I10 Essential (primary) hypertension: Secondary | ICD-10-CM

## 2023-12-28 DIAGNOSIS — R002 Palpitations: Secondary | ICD-10-CM

## 2023-12-28 MED ORDER — LOSARTAN POTASSIUM 100 MG PO TABS: 100.0000 mg | ORAL_TABLET | Freq: Every day | ORAL | 0 refills | Status: AC

## 2023-12-28 MED ORDER — HYDROCHLOROTHIAZIDE 25 MG PO TABS
25.0000 mg | ORAL_TABLET | Freq: Every day | ORAL | 0 refills | Status: AC
Start: 2023-12-28 — End: ?

## 2023-12-28 MED ORDER — METOPROLOL SUCCINATE ER 100 MG PO TB24: 100.0000 mg | ORAL_TABLET | Freq: Every day | ORAL | 1 refills | Status: DC

## 2023-12-28 NOTE — Telephone Encounter (Signed)
 Requesting New Prescription:  Prescription Request  12/28/2023  LOV: 10/25/23  What is the name of the medication or equipment? metoprolol  succinate (TOPROL -XL) 100 MG 24 hr tablet [[id number]]   Have you contacted your pharmacy to request a refill? Yes   Which pharmacy would you like this sent to?  [id number] - [place], [place] - [address] [place] [address] Phone: [phone] Fax: [phone]    Patient notified that their request is being sent to the clinical staff for review and that they should receive a response within 2 business days.   Please advise at [facility] [phone]

## 2023-12-28 NOTE — Telephone Encounter (Signed)
 Sent in medication

## 2023-12-30 ENCOUNTER — Other Ambulatory Visit: Payer: Self-pay | Admitting: Family Medicine

## 2023-12-30 DIAGNOSIS — I10 Essential (primary) hypertension: Secondary | ICD-10-CM

## 2023-12-31 NOTE — Telephone Encounter (Signed)
 Already refilled on 12/28/23 in a separate refill encounter. Will refuse this request due to this.  Unable to refill per protocol, cannot delegate.

## 2024-01-28 ENCOUNTER — Other Ambulatory Visit: Payer: Self-pay | Admitting: Medical Genetics

## 2024-02-02 ENCOUNTER — Encounter: Payer: Self-pay | Admitting: Family Medicine

## 2024-02-02 ENCOUNTER — Ambulatory Visit: Admitting: Family Medicine

## 2024-02-03 ENCOUNTER — Ambulatory Visit: Admitting: Internal Medicine

## 2024-02-06 ENCOUNTER — Ambulatory Visit: Admitting: Family Medicine

## 2024-02-18 ENCOUNTER — Other Ambulatory Visit: Payer: Self-pay | Admitting: Family Medicine

## 2024-02-18 DIAGNOSIS — E785 Hyperlipidemia, unspecified: Secondary | ICD-10-CM

## 2024-02-22 ENCOUNTER — Other Ambulatory Visit: Payer: Self-pay | Admitting: Family Medicine

## 2024-02-22 DIAGNOSIS — I10 Essential (primary) hypertension: Secondary | ICD-10-CM

## 2024-02-22 DIAGNOSIS — R002 Palpitations: Secondary | ICD-10-CM

## 2024-03-05 NOTE — Progress Notes (Unsigned)
 " Cardiology Office Note:  .   Date:  03/07/2024  ID:  Jenna Jones, DOB 12/15/1975, MRN [id number] PCP: Duanne Butler DASEN, MD  [facility] Providers Cardiologist:  Diannah SHAUNNA Maywood, MD {  History of Present Illness: .   Jenna Jones is a 49 y.o. female  with PMHx of HTN, HLD, palpitations, near syncope who reports to [facility] office for follow up.   Pertinent cardiac medical history:  Heart monitor 02/2023: NSR, average HR 79, rare PACs/PVCs  Last seen in heartcare 07/2023 by Dr. Mallipeddi for new patient evaluation of syncope and palpitations. Reported 4 episodes of feeling hot followed by near syncope and palpitations described as skipping a beat/racing.  Reviewed heart monitor as above with no significant findings.  However per phone readings reviewed, resting HR greater than 90 bpm over the last 3 months.  Started on Ivabradine  5 mg BID.  Continued on Lipitor 40 mg daily, HCTZ 25 mg daily, losartan  100 mg daily, Toprol -XL 100 mg.  Also noted patient going through perimenopause which might be causing dysautonomic symptoms and recommend follow-up with OB/GYN.  Today, reports initial improvement with Corlanor for several weeks; however, symptoms have since worsened. She restarted amlodipine  and notes mild improvement when taken with Corlanor. Reports daily palpitations described as fluttering and skipping, often occurring throughout the day and when lying down, associated with shortness of breath. She has not checked heart rate during episodes. Reports intermittent dizziness attributed to known vertigo previously evaluated by her PCP. Notes one episode of right-sided neck pain radiating to the right arm after prolonged cooking and repetitive movement, felt to be musculoskeletal. Reports transient lower extremity swelling. Denies caffeine  use. Reports daily nicotine vaping.  ROS: 10 point review of system has been reviewed and considered negative except ones been listed in the HPI.    Studies Reviewed: SABRA   EKG Interpretation Date/Time:  Wednesday March 07 2024 07:57:11 EST Ventricular Rate:  63 PR Interval:  168 QRS Duration:  74 QT Interval:  428 QTC Calculation: 437 R Axis:   73  Text Interpretation: Normal sinus rhythm Low voltage QRS When compared with ECG of 08-Nov-2020 11:55, No significant change was found Confirmed by Sheron Hallmark ([id number]) on 03/07/2024 8:00:27 AM   Heart monitor 02/2023 HR 49 - 165, average 79 bpm. Rare supraventricular and ventricular ectopy. No sustained arrhythmias. No atrial fibrillation.  CV Studies: Cardiac studies reviewed are outlined and summarized above. Otherwise please see EMR for full report.   Physical Exam:   VS:  BP 112/84   Pulse 63   Ht 5' 1 (1.549 m)   Wt 177 lb 3.2 oz (80.4 kg)   LMP 03/18/2017   SpO2 98%   BMI 33.48 kg/m    Wt Readings from Last 3 Encounters:  03/07/24 177 lb 3.2 oz (80.4 kg)  07/26/23 174 lb (78.9 kg)  06/03/23 172 lb 8 oz (78.2 kg)    GEN: Well nourished, well developed in no acute distress while sitting in chair. Accompanied by husband NECK: No JVD; No carotid bruits CARDIAC: RRR, no murmurs, rubs, gallops RESPIRATORY:  Clear to auscultation without rales, wheezing or rhonchi  ABDOMEN: Soft, non-tender, non-distended EXTREMITIES:  No edema; No deformity   ASSESSMENT AND PLAN: .   Palpitations Near syncope Prior monitor (02/2023) reassuring with NSR, rare PACs/PVCs; however, patient now reports daily symptomatic palpitations with associated SOB and worsening symptom burden. EKG today: Normal sinus rhythm, low voltage QRS, no significant change compared to prior. Possible  inappropriate sinus tachycardia, dysautonomia, or evolving arrhythmia. Order 14-day ambulatory monitor. Continue ivabradine  5 mg BID, metoprolol  succinate (Toprol -XL) 100 mg daily, and amlodipine . Will discuss increasing Ivabradine  dose with Dr. Mallipeddi. Per VM, okay to increase dose of Ivabradine  to 7.5  mg with HR > 60. Called to discuss with patient after she left. Advised to monitor HR and if HR < 60 to contact office.  Counsel on hydration, slow positional changes, and avoidance of stimulants.  Primary hypertension Blood pressure well controlled by home readings and in office today. Continue losartan  100 mg daily, hydrochlorothiazide  25 mg daily, metoprolol  succinate 100 mg daily, and amlodipine . Encourage heart-healthy, low-sodium diet (<2 g sodium daily) and physical activity as tolerated (goal >=150 minutes/week).  Hyperlipidemia LDL goal <100 Continue atorvastatin  (Lipitor) 40 mg daily. Labs to be updated with PCP at upcoming appointment.  Lipid Panel     Component Value Date/Time   CHOL 138 11/06/2021 1603   CHOL 226 (H) 01/20/2017 0947   TRIG 167 (H) 11/06/2021 1603   HDL 44 (L) 11/06/2021 1603   HDL 42 01/20/2017 0947   CHOLHDL 3.1 11/06/2021 1603   LDLCALC 69 11/06/2021 1603   LABVLDL 40 01/20/2017 0947    Nicotine Use (Vaping) Daily nicotine vaping may exacerbate palpitations and dysautonomic symptoms. Strongly encourage cessation and discuss impact on heart rate and symptoms.    Dispo: Follow up in 3 months  Signed, Lorette CINDERELLA Kapur, PA-C  "

## 2024-03-07 ENCOUNTER — Ambulatory Visit: Attending: Physician Assistant | Admitting: Physician Assistant

## 2024-03-07 ENCOUNTER — Ambulatory Visit

## 2024-03-07 ENCOUNTER — Encounter: Payer: Self-pay | Admitting: Physician Assistant

## 2024-03-07 VITALS — BP 112/84 | HR 63 | Ht 61.0 in | Wt 177.2 lb

## 2024-03-07 DIAGNOSIS — E785 Hyperlipidemia, unspecified: Secondary | ICD-10-CM | POA: Diagnosis not present

## 2024-03-07 DIAGNOSIS — R002 Palpitations: Secondary | ICD-10-CM

## 2024-03-07 DIAGNOSIS — I10 Essential (primary) hypertension: Secondary | ICD-10-CM

## 2024-03-07 DIAGNOSIS — Z720 Tobacco use: Secondary | ICD-10-CM | POA: Diagnosis not present

## 2024-03-07 DIAGNOSIS — R55 Syncope and collapse: Secondary | ICD-10-CM

## 2024-03-07 MED ORDER — IVABRADINE HCL 5 MG PO TABS: 7.5000 mg | ORAL_TABLET | Freq: Two times a day (BID) | ORAL | 3 refills | Status: AC

## 2024-03-07 NOTE — Patient Instructions (Signed)
 Medication Instructions:  Your physician recommends that you continue on your current medications as directed. Please refer to the Current Medication list given to you today.  *If you need a refill on your cardiac medications before your next appointment, please call your pharmacy*  Lab Work: None If you have labs (blood work) drawn today and your tests are completely normal, you will receive your results only by: MyChart Message (if you have MyChart) OR A paper copy in the mail If you have any lab test that is abnormal or we need to change your treatment, we will call you to review the results.  Testing/Procedures: Zio- 14 days  Follow-Up: At [facility], you and your health needs are our priority.  As part of our continuing mission to provide you with exceptional heart care, our providers are all part of one team.  This team includes your primary Cardiologist (physician) and Advanced Practice Providers or APPs (Physician Assistants and Nurse Practitioners) who all work together to provide you with the care you need, when you need it.  Your next appointment:   3 month(s)  Provider:   You may see Vishnu P Mallipeddi, MD or one of the following Advanced Practice Providers on your designated Care Team:   Brittany Strader, PA-C  [place], [place] Olivia [place], [place]     We recommend signing up for the patient portal called MyChart.  Sign up information is provided on this After Visit Summary.  MyChart is used to connect with patients for Virtual Visits (Telemedicine).  Patients are able to view lab/test results, encounter notes, upcoming appointments, etc.  Non-urgent messages can be sent to your provider as well.   To learn more about what you can do with MyChart, go to [email or web address].   Other Instructions  ZIO XT- Long Term Monitor Instructions   Your physician has requested you wear your ZIO patch monitor___14____days.   This is a single patch monitor.   [employer] supplies one patch monitor per enrollment.  Additional stickers are not available.   Please do not apply patch if you will be having a Nuclear Stress Test, Echocardiogram, Cardiac CT, MRI, or Chest Xray during the time frame you would be wearing the monitor. The patch cannot be worn during these tests.  You cannot remove and re-apply the ZIO XT patch monitor.   Your ZIO patch monitor will be sent USPS Priority mail from [facility] directly to your home address. The monitor may also be mailed to a PO BOX if home delivery is not available.   It may take 3-5 days to receive your monitor after you have been enrolled.   Once you have received you monitor, please review enclosed instructions.  Your monitor has already been registered assigning a specific monitor serial # to you.   Applying the monitor   Shave hair from upper left chest.   Hold abrader disc by orange tab.  Rub abrader in 40 strokes over left upper chest as indicated in your monitor instructions.   Clean area with 4 enclosed alcohol pads .  Use all pads to assure are is cleaned thoroughly.  Let dry.   Apply patch as indicated in monitor instructions.  Patch will be place under collarbone on left side of chest with arrow pointing upward.   Rub patch adhesive wings for 2 minutes.Remove white label marked 1.  Remove white label marked 2.  Rub patch adhesive wings for 2 additional minutes.   While looking in a mirror, press  and release button in center of patch.  A small green light will flash 3-4 times .  This will be your only indicator the monitor has been turned on.     Do not shower for the first 24 hours.  You may shower after the first 24 hours.   Press button if you feel a symptom. You will hear a small click.  Record Date, Time and Symptom in the Patient Log Book.   When you are ready to remove patch, follow instructions on last 2 pages of Patient Log Book.  Stick patch monitor onto last page of Patient  Log Book.   Place Patient Log Book in Herman box.  Use locking tab on box and tape box closed securely.  The [employer] has [employer] on it.  Please place in mailbox as soon as possible.  Your physician should have your test results approximately 7 days after the monitor has been mailed back to [facility].   Call [facility] Customer Care at [phone] if you have questions regarding your ZIO XT patch monitor.  Call them immediately if you see an orange light blinking on your monitor.   If your monitor falls off in less than 4 days contact our Monitor department at [phone].  If your monitor becomes loose or falls off after 4 days call [employer] at [phone] for suggestions on securing your monitor.

## 2024-03-08 ENCOUNTER — Ambulatory Visit: Admitting: Family Medicine

## 2024-03-08 VITALS — BP 126/72 | HR 88 | Temp 98.0°F | Ht 61.0 in | Wt 177.0 lb

## 2024-03-08 DIAGNOSIS — G4710 Hypersomnia, unspecified: Secondary | ICD-10-CM

## 2024-03-08 DIAGNOSIS — R110 Nausea: Secondary | ICD-10-CM

## 2024-03-08 DIAGNOSIS — I10 Essential (primary) hypertension: Secondary | ICD-10-CM | POA: Diagnosis not present

## 2024-03-08 DIAGNOSIS — Z23 Encounter for immunization: Secondary | ICD-10-CM | POA: Diagnosis not present

## 2024-03-08 DIAGNOSIS — E785 Hyperlipidemia, unspecified: Secondary | ICD-10-CM

## 2024-03-08 DIAGNOSIS — H8110 Benign paroxysmal vertigo, unspecified ear: Secondary | ICD-10-CM | POA: Diagnosis not present

## 2024-03-08 MED ORDER — ONDANSETRON HCL 4 MG PO TABS: 4.0000 mg | ORAL_TABLET | Freq: Three times a day (TID) | ORAL | 0 refills | Status: AC | PRN

## 2024-03-08 MED ORDER — MECLIZINE HCL 25 MG PO TABS: 25.0000 mg | ORAL_TABLET | Freq: Three times a day (TID) | ORAL | 0 refills | Status: AC | PRN

## 2024-03-08 MED ORDER — ATORVASTATIN CALCIUM 40 MG PO TABS: ORAL_TABLET | ORAL | 0 refills | Status: AC
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
## 2024-03-09 ENCOUNTER — Ambulatory Visit: Payer: Self-pay | Admitting: Family Medicine

## 2024-03-09 LAB — CBC WITH DIFFERENTIAL/PLATELET
Absolute Lymphocytes: 2621 {cells}/uL (ref 850–3900)
Absolute Monocytes: 647 {cells}/uL (ref 200–950)
Basophils Absolute: 39 {cells}/uL (ref 0–200)
Basophils Relative: 0.5 %
Eosinophils Absolute: 109 {cells}/uL (ref 15–500)
Eosinophils Relative: 1.4 %
HCT: 41.1 % (ref 35.9–46.0)
Hemoglobin: 13.9 g/dL (ref 11.7–15.5)
MCH: 31.4 pg (ref 27.0–33.0)
MCHC: 33.8 g/dL (ref 31.6–35.4)
MCV: 92.8 fL (ref 81.4–101.7)
MPV: 10.9 fL (ref 7.5–12.5)
Monocytes Relative: 8.3 %
Neutro Abs: 4384 {cells}/uL (ref 1500–7800)
Neutrophils Relative %: 56.2 %
Platelets: 411 Thousand/uL — ABNORMAL HIGH (ref 140–400)
RBC: 4.43 Million/uL (ref 3.80–5.10)
RDW: 13.3 % (ref 11.0–15.0)
Total Lymphocyte: 33.6 %
WBC: 7.8 Thousand/uL (ref 3.8–10.8)

## 2024-03-09 LAB — COMPREHENSIVE METABOLIC PANEL WITH GFR
AG Ratio: 1.6 (calc) (ref 1.0–2.5)
ALT: 38 U/L — ABNORMAL HIGH (ref 6–29)
AST: 23 U/L (ref 10–35)
Albumin: 4.2 g/dL (ref 3.6–5.1)
Alkaline phosphatase (APISO): 86 U/L (ref 37–153)
BUN: 16 mg/dL (ref 7–25)
CO2: 27 mmol/L (ref 20–32)
Calcium: 9.7 mg/dL (ref 8.6–10.4)
Chloride: 101 mmol/L (ref 98–110)
Creat: 0.92 mg/dL (ref 0.50–1.03)
Globulin: 2.7 g/dL (ref 1.9–3.7)
Glucose, Bld: 93 mg/dL (ref 65–99)
Potassium: 4.2 mmol/L (ref 3.5–5.3)
Sodium: 137 mmol/L (ref 135–146)
Total Bilirubin: 0.5 mg/dL (ref 0.2–1.2)
Total Protein: 6.9 g/dL (ref 6.1–8.1)
eGFR: 76 mL/min/1.73m2

## 2024-03-09 LAB — LIPID PANEL
Cholesterol: 225 mg/dL — ABNORMAL HIGH
HDL: 42 mg/dL — ABNORMAL LOW
Non-HDL Cholesterol (Calc): 183 mg/dL — ABNORMAL HIGH
Total CHOL/HDL Ratio: 5.4 (calc) — ABNORMAL HIGH
Triglycerides: 508 mg/dL — ABNORMAL HIGH

## 2024-04-11 ENCOUNTER — Ambulatory Visit (HOSPITAL_COMMUNITY)

## 2024-06-06 ENCOUNTER — Ambulatory Visit: Admitting: Internal Medicine

## 2024-06-07 ENCOUNTER — Ambulatory Visit: Admitting: Family Medicine
